# Patient Record
Sex: Male | Born: 1975 | Race: White | Hispanic: No | Marital: Married | Smoking: Former smoker
Health system: Southern US, Community
[De-identification: ages and names within clinical notes are randomized; demographics above are authoritative.]

## PROBLEM LIST (undated history)

## (undated) ENCOUNTER — Emergency Department: Discharge status: Absent without leave | Payer: BC Managed Care – PPO

## (undated) DIAGNOSIS — J309 Allergic rhinitis, unspecified: Secondary | ICD-10-CM

## (undated) DIAGNOSIS — I10 Essential (primary) hypertension: Secondary | ICD-10-CM

## (undated) DIAGNOSIS — F329 Major depressive disorder, single episode, unspecified: Secondary | ICD-10-CM

## (undated) DIAGNOSIS — K219 Gastro-esophageal reflux disease without esophagitis: Secondary | ICD-10-CM

## (undated) DIAGNOSIS — F419 Anxiety disorder, unspecified: Secondary | ICD-10-CM

## (undated) DIAGNOSIS — F32A Depression, unspecified: Secondary | ICD-10-CM

## (undated) HISTORY — PX: TONSILLECTOMY AND ADENOIDECTOMY: SUR1326

## (undated) HISTORY — PX: VASECTOMY: SHX75

## (undated) HISTORY — DX: Anxiety disorder, unspecified: F41.9

## (undated) HISTORY — DX: Gastro-esophageal reflux disease without esophagitis: K21.9

## (undated) HISTORY — DX: Major depressive disorder, single episode, unspecified: F32.9

## (undated) HISTORY — DX: Depression, unspecified: F32.A

## (undated) HISTORY — DX: Essential (primary) hypertension: I10

## (undated) HISTORY — PX: INGUINAL HERNIA REPAIR: SUR1180

## (undated) HISTORY — PX: FINGER SURGERY: SHX640

## (undated) HISTORY — DX: Allergic rhinitis, unspecified: J30.9

---

## 2000-08-13 ENCOUNTER — Encounter: Payer: Self-pay | Admitting: Emergency Medicine

## 2000-08-13 ENCOUNTER — Emergency Department (HOSPITAL_COMMUNITY): Admission: EM | Admit: 2000-08-13 | Discharge: 2000-08-13 | Payer: Self-pay | Admitting: Emergency Medicine

## 2002-09-09 ENCOUNTER — Ambulatory Visit (HOSPITAL_BASED_OUTPATIENT_CLINIC_OR_DEPARTMENT_OTHER): Admission: RE | Admit: 2002-09-09 | Discharge: 2002-09-09 | Payer: Self-pay | Admitting: Internal Medicine

## 2004-07-13 ENCOUNTER — Ambulatory Visit: Payer: Self-pay | Admitting: Internal Medicine

## 2005-11-06 ENCOUNTER — Ambulatory Visit: Payer: Self-pay | Admitting: Internal Medicine

## 2006-09-03 ENCOUNTER — Ambulatory Visit: Payer: Self-pay | Admitting: Internal Medicine

## 2006-09-03 LAB — CONVERTED CEMR LAB
ALT: 37 units/L (ref 0–40)
AST: 33 units/L (ref 0–37)
Albumin: 4.3 g/dL (ref 3.5–5.2)
Alkaline Phosphatase: 69 units/L (ref 39–117)
BUN: 11 mg/dL (ref 6–23)
Basophils Absolute: 0 10*3/uL (ref 0.0–0.1)
Basophils Relative: 0.6 % (ref 0.0–1.0)
CO2: 32 meq/L (ref 19–32)
Calcium: 9.6 mg/dL (ref 8.4–10.5)
Chloride: 105 meq/L (ref 96–112)
Chol/HDL Ratio, serum: 3.4
Cholesterol: 188 mg/dL (ref 0–200)
Creatinine, Ser: 1.1 mg/dL (ref 0.4–1.5)
Eosinophil percent: 4.2 % (ref 0.0–5.0)
GFR calc non Af Amer: 84 mL/min
Glomerular Filtration Rate, Af Am: 102 mL/min/{1.73_m2}
Glucose, Bld: 94 mg/dL (ref 70–99)
HCT: 45.9 % (ref 39.0–52.0)
HDL: 55.4 mg/dL (ref >39.0)
Hemoglobin: 14.9 g/dL (ref 13.0–17.0)
LDL Cholesterol: 115 mg/dL — ABNORMAL HIGH (ref 0–99)
Lymphocytes Relative: 29.9 % (ref 12.0–46.0)
MCHC: 32.5 g/dL (ref 30.0–36.0)
MCV: 89.3 fL (ref 78.0–100.0)
Monocytes Absolute: 0.6 10*3/uL (ref 0.2–0.7)
Monocytes Relative: 7.3 % (ref 3.0–11.0)
Neutro Abs: 4.6 10*3/uL (ref 1.4–7.7)
Neutrophils Relative %: 58 % (ref 43.0–77.0)
Platelets: 288 10*3/uL (ref 150–400)
Potassium: 4.3 meq/L (ref 3.5–5.1)
RBC: 5.14 M/uL (ref 4.22–5.81)
RDW: 12.3 % (ref 11.5–14.6)
Sodium: 143 meq/L (ref 135–145)
TSH: 0.76 microintl units/mL (ref 0.35–5.50)
Total Bilirubin: 1 mg/dL (ref 0.3–1.2)
Total Protein: 6.5 g/dL (ref 6.0–8.3)
Triglyceride fasting, serum: 90 mg/dL (ref 0–149)
VLDL: 18 mg/dL (ref 0–40)
WBC: 7.8 10*3/uL (ref 4.5–10.5)

## 2006-09-09 ENCOUNTER — Ambulatory Visit: Payer: Self-pay | Admitting: Internal Medicine

## 2006-11-03 ENCOUNTER — Ambulatory Visit: Payer: Self-pay | Admitting: Internal Medicine

## 2007-09-22 ENCOUNTER — Ambulatory Visit: Payer: Self-pay | Admitting: Internal Medicine

## 2007-09-22 DIAGNOSIS — F17200 Nicotine dependence, unspecified, uncomplicated: Secondary | ICD-10-CM

## 2007-09-22 DIAGNOSIS — F329 Major depressive disorder, single episode, unspecified: Secondary | ICD-10-CM

## 2008-03-21 ENCOUNTER — Ambulatory Visit: Payer: Self-pay | Admitting: Internal Medicine

## 2008-04-18 ENCOUNTER — Telehealth: Payer: Self-pay | Admitting: Internal Medicine

## 2008-08-23 ENCOUNTER — Telehealth: Payer: Self-pay | Admitting: Internal Medicine

## 2008-10-11 ENCOUNTER — Ambulatory Visit: Payer: Self-pay | Admitting: Internal Medicine

## 2008-10-11 LAB — CONVERTED CEMR LAB
ALT: 21 units/L (ref 0–53)
AST: 26 units/L (ref 0–37)
Albumin: 4.3 g/dL (ref 3.5–5.2)
Alkaline Phosphatase: 68 units/L (ref 39–117)
BUN: 22 mg/dL (ref 6–23)
Basophils Absolute: 0 10*3/uL (ref 0.0–0.1)
Basophils Relative: 0.5 % (ref 0.0–3.0)
Bilirubin, Direct: 0.1 mg/dL (ref 0.0–0.3)
CO2: 30 meq/L (ref 19–32)
Calcium: 9.4 mg/dL (ref 8.4–10.5)
Chloride: 102 meq/L (ref 96–112)
Cholesterol: 197 mg/dL (ref 0–200)
Creatinine, Ser: 0.8 mg/dL (ref 0.4–1.5)
Eosinophils Absolute: 0.2 10*3/uL (ref 0.0–0.7)
Eosinophils Relative: 2.9 % (ref 0.0–5.0)
GFR calc Af Amer: 144 mL/min
GFR calc non Af Amer: 119 mL/min
Glucose, Bld: 79 mg/dL (ref 70–99)
HCT: 46.3 % (ref 39.0–52.0)
HDL: 45 mg/dL (ref >39.0)
Hemoglobin: 15.9 g/dL (ref 13.0–17.0)
LDL Cholesterol: 129 mg/dL — ABNORMAL HIGH (ref 0–99)
Lymphocytes Relative: 27.7 % (ref 12.0–46.0)
MCHC: 34.4 g/dL (ref 30.0–36.0)
MCV: 91.4 fL (ref 78.0–100.0)
Monocytes Absolute: 0.8 10*3/uL (ref 0.1–1.0)
Monocytes Relative: 11.9 % (ref 3.0–12.0)
Neutro Abs: 3.9 10*3/uL (ref 1.4–7.7)
Neutrophils Relative %: 57 % (ref 43.0–77.0)
Platelets: 223 10*3/uL (ref 150–400)
Potassium: 4 meq/L (ref 3.5–5.1)
RBC: 5.06 M/uL (ref 4.22–5.81)
RDW: 12 % (ref 11.5–14.6)
Sodium: 138 meq/L (ref 135–145)
TSH: 1.12 microintl units/mL (ref 0.35–5.50)
Total Bilirubin: 1 mg/dL (ref 0.3–1.2)
Total CHOL/HDL Ratio: 4.4
Total Protein: 6.8 g/dL (ref 6.0–8.3)
Triglycerides: 114 mg/dL (ref 0–149)
VLDL: 23 mg/dL (ref 0–40)
WBC: 6.8 10*3/uL (ref 4.5–10.5)

## 2008-10-12 ENCOUNTER — Telehealth: Payer: Self-pay | Admitting: Internal Medicine

## 2008-10-18 ENCOUNTER — Ambulatory Visit: Payer: Self-pay | Admitting: Internal Medicine

## 2009-01-20 ENCOUNTER — Telehealth: Payer: Self-pay | Admitting: Internal Medicine

## 2009-02-20 ENCOUNTER — Ambulatory Visit: Payer: Self-pay | Admitting: Gastroenterology

## 2009-02-20 DIAGNOSIS — R1310 Dysphagia, unspecified: Secondary | ICD-10-CM | POA: Insufficient documentation

## 2009-03-04 ENCOUNTER — Emergency Department (HOSPITAL_COMMUNITY): Admission: EM | Admit: 2009-03-04 | Discharge: 2009-03-04 | Payer: Self-pay | Admitting: Emergency Medicine

## 2009-03-07 ENCOUNTER — Telehealth (INDEPENDENT_AMBULATORY_CARE_PROVIDER_SITE_OTHER): Payer: Self-pay | Admitting: *Deleted

## 2009-03-08 ENCOUNTER — Ambulatory Visit: Payer: Self-pay | Admitting: Gastroenterology

## 2009-03-09 ENCOUNTER — Ambulatory Visit: Payer: Self-pay | Admitting: Family Medicine

## 2009-03-10 ENCOUNTER — Encounter: Payer: Self-pay | Admitting: Gastroenterology

## 2009-03-16 ENCOUNTER — Telehealth (INDEPENDENT_AMBULATORY_CARE_PROVIDER_SITE_OTHER): Payer: Self-pay | Admitting: *Deleted

## 2009-06-23 ENCOUNTER — Ambulatory Visit: Payer: Self-pay | Admitting: Gastroenterology

## 2009-08-01 ENCOUNTER — Telehealth: Payer: Self-pay | Admitting: Internal Medicine

## 2009-08-03 ENCOUNTER — Telehealth: Payer: Self-pay | Admitting: Family Medicine

## 2009-08-04 ENCOUNTER — Encounter (INDEPENDENT_AMBULATORY_CARE_PROVIDER_SITE_OTHER): Payer: Self-pay | Admitting: *Deleted
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
## 2009-09-14 ENCOUNTER — Encounter: Payer: Self-pay | Admitting: Internal Medicine

## 2009-09-25 ENCOUNTER — Telehealth (INDEPENDENT_AMBULATORY_CARE_PROVIDER_SITE_OTHER): Payer: Self-pay | Admitting: *Deleted

## 2009-10-31 ENCOUNTER — Ambulatory Visit: Payer: Self-pay | Admitting: Internal Medicine

## 2009-10-31 LAB — CONVERTED CEMR LAB
ALT: 16 units/L (ref 0–53)
AST: 24 units/L (ref 0–37)
Albumin: 4.6 g/dL (ref 3.5–5.2)
Alkaline Phosphatase: 60 units/L (ref 39–117)
BUN: 12 mg/dL (ref 6–23)
Basophils Absolute: 0 10*3/uL (ref 0.0–0.1)
Basophils Relative: 0.2 % (ref 0.0–3.0)
Bilirubin, Direct: 0.1 mg/dL (ref 0.0–0.3)
CO2: 32 meq/L (ref 19–32)
Calcium: 9.5 mg/dL (ref 8.4–10.5)
Chloride: 108 meq/L (ref 96–112)
Cholesterol: 184 mg/dL (ref 0–200)
Creatinine, Ser: 1 mg/dL (ref 0.4–1.5)
Eosinophils Absolute: 0.2 10*3/uL (ref 0.0–0.7)
Eosinophils Relative: 3.9 % (ref 0.0–5.0)
GFR calc non Af Amer: 91.39 mL/min (ref >60)
Glucose, Bld: 87 mg/dL (ref 70–99)
HCT: 43.8 % (ref 39.0–52.0)
HDL: 53.1 mg/dL (ref >39.00)
Hemoglobin: 14.7 g/dL (ref 13.0–17.0)
LDL Cholesterol: 114 mg/dL — ABNORMAL HIGH (ref 0–99)
Lymphocytes Relative: 33.6 % (ref 12.0–46.0)
Lymphs Abs: 1.8 10*3/uL (ref 0.7–4.0)
MCHC: 33.5 g/dL (ref 30.0–36.0)
MCV: 94.2 fL (ref 78.0–100.0)
Monocytes Absolute: 0.4 10*3/uL (ref 0.1–1.0)
Monocytes Relative: 8 % (ref 3.0–12.0)
Neutro Abs: 3.1 10*3/uL (ref 1.4–7.7)
Neutrophils Relative %: 54.3 % (ref 43.0–77.0)
Platelets: 219 10*3/uL (ref 150.0–400.0)
Potassium: 4.2 meq/L (ref 3.5–5.1)
RBC: 4.65 M/uL (ref 4.22–5.81)
RDW: 12.3 % (ref 11.5–14.6)
Sodium: 143 meq/L (ref 135–145)
TSH: 1.3 microintl units/mL (ref 0.35–5.50)
Total Bilirubin: 0.9 mg/dL (ref 0.3–1.2)
Total CHOL/HDL Ratio: 3
Total Protein: 7.1 g/dL (ref 6.0–8.3)
Triglycerides: 85 mg/dL (ref 0.0–149.0)
VLDL: 17 mg/dL (ref 0.0–40.0)
WBC: 5.5 10*3/uL (ref 4.5–10.5)

## 2009-12-05 ENCOUNTER — Ambulatory Visit: Payer: Self-pay | Admitting: Internal Medicine

## 2010-10-02 NOTE — Assessment & Plan Note (Signed)
Summary: cpx//ccm [facility] BMP/NJR   Vital Signs:  Patient profile:   34 year old male Height:      68.75 inches Weight:      225 pounds BMI:     33.59 Pulse rate:   66 / minute Pulse rhythm:   regular Resp:     12 per minute BP sitting:   112 / 76  (left arm) Cuff size:   regular  Vitals Entered By: Gladis Riffle, RN (December 05, 2009 8:12 AM) CC: cpx, labs done Is Patient Diabetic? No   Primary Care Provider:  Leeanne Deed  CC:  cpx and labs done.  History of Present Illness: CPX  recurrent dysphagia: food gets stuck once weekly: reviewed EGD and pathology.     Preventive Screening-Counseling & Management  Alcohol-Tobacco     Smoking Status: quit     Year Quit: 2009  Current Problems (verified): 1)  Dysphagia Unspecified  (ICD-787.20) 2)  Preventive Health Care  (ICD-V70.0) 3)  Tobacco Use  (ICD-305.1) 4)  Depression  (ICD-311)  Current Medications (verified): 1)  Citalopram Hydrobromide 40 Mg Tabs (Citalopram Hydrobromide) .... Once Daily 2)  Benadryl 25 Mg Caps (Diphenhydramine Hcl) .... As Needed At Bedtime 3)  Omeprazole 40 Mg  Cpdr (Omeprazole) .Marland Kitchen.. 1 Each Day 30 Minutes Before Meal  Allergies: 1)  ! Penicillin V Potassium (Penicillin V Potassium)   Impression & Recommendations:  Problem # 1:  PREVENTIVE HEALTH CARE (ICD-V70.0) helath maint UTD advised daily exercise (he is currently doing) advised weight loss  Complete Medication List: 1)  Citalopram Hydrobromide 40 Mg Tabs (Citalopram hydrobromide) .... Once daily 2)  Benadryl 25 Mg Caps (Diphenhydramine hcl) .... As needed at bedtime 3)  Omeprazole 20 Mg Cpdr (Omeprazole) .... One by mouth daily  Patient Instructions: 1)  . Prescriptions: OMEPRAZOLE 20 MG CPDR (OMEPRAZOLE) one by mouth daily  #90 x 3   Entered and Authorized by:   Birdie Sons MD   Signed by:   Birdie Sons MD on 12/05/2009   Method used:   Electronically to        [facility]* (retail)       [address]       [place], [place]  [id number]       Ph: [phone]       Fax: [phone]   RxID:   [id number] OMEPRAZOLE 40 MG  CPDR (OMEPRAZOLE) 1 each day 30 minutes before meal  #90 x 1   Entered and Authorized by:   Birdie Sons MD   Signed by:   Birdie Sons MD on 12/05/2009   Method used:   Electronically to        [facility]* (retail)       [address]       [place], [place]  [address]       Ph: [phone]       Fax: [phone]   RxID:   [id number] CITALOPRAM HYDROBROMIDE 40 MG TABS (CITALOPRAM HYDROBROMIDE) once daily  #90 x 3   Entered and Authorized by:   Birdie Sons MD   Signed by:   Birdie Sons MD on 12/05/2009   Method used:   Electronically to        [facility]* (retail)       [address]       [place], [place]  [address]       Ph: [phone]       Fax: [phone]  RxID:   [id number]  Physical Exam General Appearance: well developed, well nourished, no acute distress Eyes: conjunctiva and lids normal, PERRL, EOMI,  Ears, Nose, Mouth, Throat: TM clear, nares clear, oral exam WNL Neck: supple, no lymphadenopathy, no thyromegaly, no JVD Respiratory: clear to auscultation and percussion, respiratory effort normal Cardiovascular: regular rate and rhythm, S1-S2, no murmur, rub or gallop, no bruits, peripheral pulses normal and symmetric, no cyanosis, clubbing, edema or varicosities Chest: no scars, masses, tenderness; no asymmetry, skin changes, nipple discharge, no gynecomastia   Gastrointestinal: soft, non-tender; no hepatosplenomegaly, masses; active bowel sounds all quadrants, Lymphatic: no cervical, axillary or inguinal adenopathy Musculoskeletal: gait normal, muscle tone and strength WNL, no joint swelling, effusions, discoloration, crepitus  Skin: clear, good turgor, color WNL, no rashes, lesions, or ulcerations Neurologic: normal mental status, normal reflexes, normal strength, sensation, and  motion Psychiatric: alert; oriented to person, place and time Other Exam:

## 2010-10-02 NOTE — Letter (Signed)
Summary: [facility]  [facility]   Imported By: Maryln Gottron 10/06/2009 14:13:47  _____________________________________________________________________  External Attachment:    Type:   Image     Comment:   External Document

## 2010-10-02 NOTE — Progress Notes (Signed)
Summary: Record request  Request for records received from [facility]. Request forwarded to [facility]. Wilder Glade  September 25, 2009 4:32 PM

## 2010-11-02 ENCOUNTER — Other Ambulatory Visit: Payer: Self-pay | Admitting: Internal Medicine

## 2010-11-22 ENCOUNTER — Other Ambulatory Visit (INDEPENDENT_AMBULATORY_CARE_PROVIDER_SITE_OTHER): Payer: Self-pay | Admitting: Internal Medicine

## 2010-11-22 DIAGNOSIS — Z0000 Encounter for general adult medical examination without abnormal findings: Secondary | ICD-10-CM

## 2010-11-22 LAB — BASIC METABOLIC PANEL
BUN: 19 mg/dL (ref 6–23)
CO2: 29 mEq/L (ref 19–32)
Calcium: 9 mg/dL (ref 8.4–10.5)
Chloride: 101 mEq/L (ref 96–112)
Creatinine, Ser: 1 mg/dL (ref 0.4–1.5)
GFR: 96.35 mL/min (ref >60.00)
Glucose, Bld: 84 mg/dL (ref 70–99)
Potassium: 4.7 mEq/L (ref 3.5–5.1)
Sodium: 136 mEq/L (ref 135–145)

## 2010-11-22 LAB — CBC WITH DIFFERENTIAL/PLATELET
Basophils Relative: 0.5 % (ref 0.0–3.0)
Eosinophils Absolute: 0.3 10*3/uL (ref 0.0–0.7)
Eosinophils Relative: 4.8 % (ref 0.0–5.0)
Hemoglobin: 14.5 g/dL (ref 13.0–17.0)
Lymphocytes Relative: 33.9 % (ref 12.0–46.0)
MCHC: 33.8 g/dL (ref 30.0–36.0)
MCV: 91.4 fl (ref 78.0–100.0)
Monocytes Absolute: 0.5 10*3/uL (ref 0.1–1.0)
Neutro Abs: 3.2 10*3/uL (ref 1.4–7.7)
RBC: 4.7 Mil/uL (ref 4.22–5.81)
WBC: 6.1 10*3/uL (ref 4.5–10.5)

## 2010-11-22 LAB — POCT URINALYSIS DIPSTICK
Bilirubin, UA: NEGATIVE
Glucose, UA: NEGATIVE
Ketones, UA: NEGATIVE
Leukocytes, UA: NEGATIVE
Nitrite, UA: NEGATIVE
pH, UA: 7

## 2010-11-22 LAB — HEPATIC FUNCTION PANEL
ALT: 19 U/L (ref 0–53)
Albumin: 4.2 g/dL (ref 3.5–5.2)
Alkaline Phosphatase: 60 U/L (ref 39–117)
Total Protein: 6.5 g/dL (ref 6.0–8.3)

## 2010-11-22 LAB — LIPID PANEL
HDL: 41.7 mg/dL (ref >39.00)
Total CHOL/HDL Ratio: 4

## 2010-11-22 LAB — TSH: TSH: 1.16 u[IU]/mL (ref 0.35–5.50)

## 2010-12-06 ENCOUNTER — Encounter: Payer: Self-pay | Admitting: Internal Medicine

## 2010-12-10 ENCOUNTER — Ambulatory Visit (INDEPENDENT_AMBULATORY_CARE_PROVIDER_SITE_OTHER): Payer: BLUE CROSS/BLUE SHIELD | Admitting: Internal Medicine

## 2010-12-10 ENCOUNTER — Encounter: Payer: Self-pay | Admitting: Internal Medicine

## 2010-12-10 DIAGNOSIS — R1310 Dysphagia, unspecified: Secondary | ICD-10-CM

## 2010-12-10 DIAGNOSIS — Z0000 Encounter for general adult medical examination without abnormal findings: Secondary | ICD-10-CM

## 2010-12-10 DIAGNOSIS — F17200 Nicotine dependence, unspecified, uncomplicated: Secondary | ICD-10-CM

## 2010-12-10 DIAGNOSIS — F909 Attention-deficit hyperactivity disorder, unspecified type: Secondary | ICD-10-CM | POA: Insufficient documentation

## 2010-12-10 MED ORDER — VARENICLINE TARTRATE 1 MG PO TABS: 1.0000 mg | ORAL_TABLET | Freq: Two times a day (BID) | ORAL | Status: AC

## 2010-12-10 MED ORDER — ATOMOXETINE HCL 40 MG PO CAPS: 40.0000 mg | ORAL_CAPSULE | Freq: Every day | ORAL | Status: DC

## 2010-12-10 MED ORDER — VARENICLINE TARTRATE 0.5 MG X 11 & 1 MG X 42 PO MISC: ORAL | Status: AC

## 2010-12-10 NOTE — Assessment & Plan Note (Signed)
Patient has had remarkably good results on a PPI. He'll continue over-the-counter usage. I suspect this would get better if he lost weight.

## 2010-12-10 NOTE — Progress Notes (Signed)
  Subjective:    Patient ID: Ernest Bowen, male    DOB: 31-Aug-1976, 35 y.o.   MRN: [id number]  HPI  cpx  Chewing tobacco again---would like to try Chantix again (worked previously)  Mood-- doing better  New problem: pt states he was dx with ADHD as a "kid". He admits to mind wondering all of the time. Sxs affect his job and home life. Pt states that he had a lot of testing at the time.   Past Medical History  Diagnosis Date  . Depression    No past surgical history on file.  reports that he has quit smoking. His smokeless tobacco use includes Chew. He reports that he drinks alcohol. He reports that he does not use illicit drugs. family history includes Colon cancer in his maternal grandfather. Allergies  Allergen Reactions  . Penicillins     REACTION: as child     Review of Systems  patient denies chest pain, shortness of breath, orthopnea. Denies lower extremity edema, abdominal pain, change in appetite, change in bowel movements. Patient denies rashes, musculoskeletal complaints. No other specific complaints in a complete review of systems.      Objective:   Physical Exam Well-developed male in no acute distress. HEENT exam atraumatic, normocephalic, extraocular muscles are intact. Conjunctivae are pink without exudate. Neck is supple without lymphadenopathy, thyromegaly, jugular venous distention. Chest is clear to auscultation without increased work of breathing. Cardiac exam S1-S2 are regular. The PMI is normal. No significant murmurs or gallops. Abdominal exam active bowel sounds, soft, nontender. No abdominal bruits. Extremities no clubbing cyanosis or edema. Peripheral pulses are normal without bruits. Neurologic exam alert and oriented without any motor or sensory deficits.      Assessment & Plan:  Well visit: health maint UTD--- Quit all tobacco use

## 2010-12-10 NOTE — Assessment & Plan Note (Signed)
Discussed need for cessation chantix---side effects discussed

## 2010-12-10 NOTE — Assessment & Plan Note (Signed)
This is a new diagnosis, however, I will use testing based on his diagnoses as a child. He can't afford additional testing at this time. I'm concerned he won't be able to afford Strattera either. I discussed with  I discussed side effects. I've given him a prescription for Strattera.

## 2010-12-11 ENCOUNTER — Telehealth: Payer: Self-pay | Admitting: *Deleted

## 2010-12-11 NOTE — Telephone Encounter (Signed)
Pt is asking for Strattera samples.  Advised we do not carry those.

## 2010-12-12 ENCOUNTER — Telehealth: Payer: Self-pay | Admitting: *Deleted

## 2010-12-12 NOTE — Telephone Encounter (Signed)
Pt can't afford strattera, pharmacy wants to know if rx can be changed

## 2010-12-13 NOTE — Telephone Encounter (Signed)
Pt does not want a stimulant so he is going to tough it out.  Will call back if he needs it changed

## 2010-12-13 NOTE — Telephone Encounter (Signed)
The only other option is a stimulant- adderall 10 mg 1 po qd #30/0 refills if the patient wants to try it (he did not want to at the time of OV)

## 2011-01-10 ENCOUNTER — Telehealth: Payer: Self-pay | Admitting: Internal Medicine

## 2011-01-10 DIAGNOSIS — F909 Attention-deficit hyperactivity disorder, unspecified type: Secondary | ICD-10-CM

## 2011-01-10 MED ORDER — ATOMOXETINE HCL 80 MG PO CAPS: 80.0000 mg | ORAL_CAPSULE | Freq: Every day | ORAL | Status: DC

## 2011-01-10 NOTE — Telephone Encounter (Signed)
Ok to increase to 80 mg po qd

## 2011-01-10 NOTE — Telephone Encounter (Signed)
Needs new rx for Strattera 80mg , sent to [facility].  Pt was told by md that if the 40mg  was not enough, then he could call in for 80mg .

## 2011-01-10 NOTE — Telephone Encounter (Signed)
rx sent in to [facility], pt aware

## 2011-03-12 ENCOUNTER — Other Ambulatory Visit: Payer: Self-pay | Admitting: *Deleted

## 2011-03-12 MED ORDER — OMEPRAZOLE 20 MG PO CPDR: 20.0000 mg | DELAYED_RELEASE_CAPSULE | Freq: Every day | ORAL | Status: DC

## 2011-03-22 ENCOUNTER — Other Ambulatory Visit: Payer: Self-pay | Admitting: *Deleted

## 2011-03-22 MED ORDER — OMEPRAZOLE 20 MG PO CPDR: 20.0000 mg | DELAYED_RELEASE_CAPSULE | Freq: Every day | ORAL | Status: DC

## 2011-03-26 ENCOUNTER — Other Ambulatory Visit: Payer: Self-pay | Admitting: *Deleted

## 2011-05-03 ENCOUNTER — Other Ambulatory Visit: Payer: Self-pay | Admitting: Internal Medicine

## 2011-05-03 DIAGNOSIS — F909 Attention-deficit hyperactivity disorder, unspecified type: Secondary | ICD-10-CM

## 2011-05-03 NOTE — Telephone Encounter (Signed)
Pt called and has changed pharmacies.Pt needs refills for atomoxetine (STRATTERA) 80 MG capsule  Pt now uses [facility] [phone].

## 2011-05-07 MED ORDER — ATOMOXETINE HCL 80 MG PO CAPS: 80.0000 mg | ORAL_CAPSULE | Freq: Every day | ORAL | Status: DC

## 2011-05-28 ENCOUNTER — Other Ambulatory Visit: Payer: Self-pay | Admitting: *Deleted

## 2011-05-28 MED ORDER — OMEPRAZOLE 20 MG PO CPDR: 20.0000 mg | DELAYED_RELEASE_CAPSULE | Freq: Every day | ORAL | Status: DC

## 2011-06-14 ENCOUNTER — Other Ambulatory Visit: Payer: Self-pay | Admitting: *Deleted

## 2011-06-14 MED ORDER — CITALOPRAM HYDROBROMIDE 40 MG PO TABS: 40.0000 mg | ORAL_TABLET | Freq: Every day | ORAL | Status: DC

## 2011-06-17 ENCOUNTER — Other Ambulatory Visit: Payer: Self-pay | Admitting: *Deleted

## 2011-06-17 MED ORDER — CITALOPRAM HYDROBROMIDE 40 MG PO TABS: 40.0000 mg | ORAL_TABLET | Freq: Every day | ORAL | Status: DC

## 2011-10-14 ENCOUNTER — Telehealth: Payer: Self-pay | Admitting: Internal Medicine

## 2011-10-16 NOTE — Telephone Encounter (Signed)
Opened in error

## 2012-05-03 ENCOUNTER — Other Ambulatory Visit: Payer: Self-pay | Admitting: Internal Medicine

## 2012-05-05 ENCOUNTER — Ambulatory Visit: Payer: BLUE CROSS/BLUE SHIELD | Admitting: Family

## 2012-05-06 ENCOUNTER — Ambulatory Visit (INDEPENDENT_AMBULATORY_CARE_PROVIDER_SITE_OTHER): Payer: Self-pay | Admitting: Family Medicine

## 2012-05-06 VITALS — BP 110/82 | Temp 97.7°F | Wt 233.0 lb

## 2012-05-06 DIAGNOSIS — K219 Gastro-esophageal reflux disease without esophagitis: Secondary | ICD-10-CM

## 2012-05-06 DIAGNOSIS — F329 Major depressive disorder, single episode, unspecified: Secondary | ICD-10-CM

## 2012-05-06 DIAGNOSIS — F909 Attention-deficit hyperactivity disorder, unspecified type: Secondary | ICD-10-CM

## 2012-05-06 MED ORDER — ATOMOXETINE HCL 80 MG PO CAPS: 80.0000 mg | ORAL_CAPSULE | Freq: Every day | ORAL | Status: DC

## 2012-05-06 MED ORDER — OMEPRAZOLE 20 MG PO CPDR: 20.0000 mg | DELAYED_RELEASE_CAPSULE | Freq: Every day | ORAL | Status: DC

## 2012-05-06 MED ORDER — CITALOPRAM HYDROBROMIDE 40 MG PO TABS: 40.0000 mg | ORAL_TABLET | Freq: Every day | ORAL | Status: DC

## 2012-05-06 NOTE — Progress Notes (Signed)
  Subjective:    Patient ID: Ernest Bowen, male    DOB: [birth year], 36 y.o.   MRN: [id number]  HPI  Medical followup. Patient history of depression, ADHD, and GERD. Symptomatically stable. Strattera increased 80 mg last year and this helped tremendously.  Has helped with focusing and completing work issues. He has history of anxiety and depression stable on Celexa 40 mg daily. No recent GERD symptoms. Question of eosinophilic esophagitis by previous endoscopy. He does not recall ever treated with topical steroids. No recent dysphagia. No appetite or weight changes.  Remains on Prilosec.  Breakthrough GERD symptoms when trying to stop.  Past Medical History  Diagnosis Date  . Depression    No past surgical history on file.  reports that he has quit smoking. His smokeless tobacco use includes Chew. He reports that he drinks alcohol. He reports that he does not use illicit drugs. family history includes Colon cancer in his maternal grandfather. Allergies  Allergen Reactions  . Penicillins     REACTION: as child      Review of Systems  Respiratory: Negative for cough and shortness of breath.   Cardiovascular: Negative for chest pain.  Gastrointestinal: Negative for abdominal pain.  Psychiatric/Behavioral: Negative for dysphoric mood. The patient is not nervous/anxious.        Objective:   Physical Exam  Constitutional: He appears well-developed and well-nourished.  Cardiovascular: Normal rate and regular rhythm.   Pulmonary/Chest: Effort normal and breath sounds normal. No respiratory distress. He has no wheezes. He has no rales.  Musculoskeletal: He exhibits no edema.  Psychiatric: He has a normal mood and affect. His behavior is normal.          Assessment & Plan:  #1 ADHD. Refill Strattera for one year #2 GERD. Symptomatically stable. Refill omeprazole for one year #3 history of anxiety/depression. Stable. Refill Celexa for one year

## 2012-05-07 ENCOUNTER — Encounter: Payer: Self-pay | Admitting: Family Medicine

## 2012-05-07 DIAGNOSIS — K219 Gastro-esophageal reflux disease without esophagitis: Secondary | ICD-10-CM | POA: Insufficient documentation

## 2012-06-02 ENCOUNTER — Other Ambulatory Visit: Payer: Self-pay | Admitting: Internal Medicine

## 2012-06-16 ENCOUNTER — Ambulatory Visit (INDEPENDENT_AMBULATORY_CARE_PROVIDER_SITE_OTHER): Payer: Managed Care, Other (non HMO) | Admitting: Family Medicine

## 2012-06-16 ENCOUNTER — Encounter: Payer: Self-pay | Admitting: Family Medicine

## 2012-06-16 VITALS — BP 130/78 | HR 73 | Temp 98.9°F | Wt 232.0 lb

## 2012-06-16 DIAGNOSIS — J029 Acute pharyngitis, unspecified: Secondary | ICD-10-CM

## 2012-06-16 DIAGNOSIS — J069 Acute upper respiratory infection, unspecified: Secondary | ICD-10-CM

## 2012-06-16 DIAGNOSIS — R509 Fever, unspecified: Secondary | ICD-10-CM

## 2012-06-16 LAB — POCT INFLUENZA A/B: Influenza A, POC: NEGATIVE

## 2012-06-16 MED ORDER — GUAIFENESIN-CODEINE 100-10 MG/5ML PO SYRP: 5.0000 mL | ORAL_SOLUTION | Freq: Every evening | ORAL | Status: DC | PRN

## 2012-06-16 MED ORDER — SULFAMETHOXAZOLE-TRIMETHOPRIM 800-160 MG PO TABS: 1.0000 | ORAL_TABLET | Freq: Two times a day (BID) | ORAL | Status: DC

## 2012-06-16 MED ORDER — FLUTICASONE PROPIONATE 50 MCG/ACT NA SUSP: 2.0000 | Freq: Every day | NASAL | Status: DC

## 2012-06-16 NOTE — Progress Notes (Signed)
Chief Complaint  Patient presents with  . [facility]    fever, headache, coughing fits.     HPI:  Started 4 days ago Symptoms: headache all over, cough, tired, drainage, fevers, body chills and body aches (fever up to 102 and had fever last night), sore throat, nasal congestion Needs something for cough [facility] and [facility] test which was negative and told to use claritin Has been using claritin and this has helped nasal congestion. Denies: CP, SOB, vision changes Denies mono, flu, strep contact or insect bites or camping or recent travel ROS: See pertinent positives and negatives per HPI.  Past Medical History  Diagnosis Date  . Depression     Family History  Problem Relation Age of Onset  . Colon cancer Maternal Grandfather     History   Social History  . Marital Status: Married    Spouse Name: N/A    Number of Children: N/A  . Years of Education: N/A   Social History Main Topics  . Smoking status: Former Games developer  . Smokeless tobacco: Current User    Types: Chew  . Alcohol Use: Yes  . Drug Use: No  . Sexually Active: None   Other Topics Concern  . None   Social History Narrative  . None    Current outpatient prescriptions:atomoxetine (STRATTERA) 80 MG capsule, Take 1 capsule (80 mg total) by mouth daily., Disp: 30 capsule, Rfl: 11;  citalopram (CELEXA) 40 MG tablet, Take 1 tablet (40 mg total) by mouth daily., Disp: 30 tablet, Rfl: 11;  omeprazole (PRILOSEC) 20 MG capsule, Take 1 capsule (20 mg total) by mouth daily., Disp: 30 capsule, Rfl: 11 omeprazole (PRILOSEC) 20 MG capsule, TAKE ONE CAPSULE BY MOUTH EVERY DAY, Disp: 90 capsule, Rfl: 2;  fluticasone (FLONASE) 50 MCG/ACT nasal spray, Place 2 sprays into the nose daily., Disp: 16 g, Rfl: 6;  guaiFENesin-codeine (ROBITUSSIN AC) 100-10 MG/5ML syrup, Take 5 mLs by mouth at bedtime as needed for cough., Disp: 120 mL, Rfl: 0 sulfamethoxazole-trimethoprim (BACTRIM DS,SEPTRA DS) 800-160 MG per  tablet, Take 1 tablet by mouth 2 (two) times daily., Disp: 20 tablet, Rfl: 0  EXAM:  Filed Vitals:   06/16/12 1338  BP: 130/78  Pulse: 73  Temp: 98.9 F (37.2 C)    There is no height on file to calculate BMI.  GENERAL: vitals reviewed and listed above, alert, oriented, appears well hydrated and in no acute distress  HEENT: atraumatic, conjunttiva clear, no obvious abnormalities on inspection of external nose and ears - ear canals normal, some mild clear fluid behind clear, non-bulging TMs, white rhinorrhea with erythematous swollen turbinates, PND with oropharyngeal erythema  NECK: no obvious masses on inspection, mild mobile tender ant cervical LAD, no meningeal signs  LUNGS: clear to auscultation bilaterally, no wheezes, rales or rhonchi, good air movement  CV: HRRR, no peripheral edema  MS: moves all extremities without noticeable abnormality  NEURO: CN II-XII grossly intact and PERRLA  PSYCH: pleasant and cooperative, no obvious depression or anxiety  ASSESSMENT AND PLAN:  Discussed the following assessment and plan:  1. Upper respiratory infection  POC Influenza A/B, POC Rapid Strep A, Throat culture, guaiFENesin-codeine (ROBITUSSIN AC) 100-10 MG/5ML syrup, fluticasone (FLONASE) 50 MCG/ACT nasal spray   -symptoms c/w with influenza, will check flu swab - also with ongoing upper resp symptoms and fever last so night feel abx warranted if flu negative (would use bactrim as pen allergy and on celexa)- will also check strep though  doubt this etiology with cough -cough medication and supportive care measures provided -Patient advised to return or notify a doctor immediately if symptoms worsen or persist or new concerns arise.  Patient Instructions  NSTRUCTIONS FOR UPPER RESPIRATORY INFECTION:  -plenty of rest and fluids  -nasal saline wash 2-3 times daily (use prepackaged nasal saline or bottled/distilled water if making your own)   -clean nose with nasal saline before  using the nasal steroid or sinex  -can use sinex nasal spray for drainage and nasal congestion - but do NOT use longer then 3-4 days  -can use tylenol or ibuprofen as directed for aches and sorethroat  -in the winter time, using a humidifier at night is helpful (please follow cleaning instructions)  -if you are taking a cough medication - use only as directed, may also try a teaspoon of honey to coat the throat and throat lozenges  -for sore throat, salt water gargles can help  -follow up immediately if you have continued fevers, are worsening or not getting better in 2-3 days      Juni Glaab, Dahlia Client R.

## 2012-06-16 NOTE — Patient Instructions (Addendum)
NSTRUCTIONS FOR UPPER RESPIRATORY INFECTION:  -plenty of rest and fluids  -nasal saline wash 2-3 times daily (use prepackaged nasal saline or bottled/distilled water if making your own)   -clean nose with nasal saline before using the nasal steroid or sinex  -can use sinex nasal spray for drainage and nasal congestion - but do NOT use longer then 3-4 days  -can use tylenol or ibuprofen as directed for aches and sorethroat  -in the winter time, using a humidifier at night is helpful (please follow cleaning instructions)  -if you are taking a cough medication - use only as directed, may also try a teaspoon of honey to coat the throat and throat lozenges  -for sore throat, salt water gargles can help  -follow up immediately if you have continued fevers, are worsening or not getting better in 2-3 days

## 2012-11-20 ENCOUNTER — Ambulatory Visit (INDEPENDENT_AMBULATORY_CARE_PROVIDER_SITE_OTHER): Payer: Managed Care, Other (non HMO) | Admitting: Licensed Clinical Social Worker

## 2012-11-20 DIAGNOSIS — F411 Generalized anxiety disorder: Secondary | ICD-10-CM

## 2013-04-28 ENCOUNTER — Other Ambulatory Visit: Payer: Self-pay | Admitting: Family Medicine

## 2013-05-31 ENCOUNTER — Other Ambulatory Visit: Payer: Self-pay | Admitting: Family Medicine

## 2013-09-28 ENCOUNTER — Other Ambulatory Visit: Payer: Self-pay | Admitting: Internal Medicine

## 2014-02-01 ENCOUNTER — Ambulatory Visit (INDEPENDENT_AMBULATORY_CARE_PROVIDER_SITE_OTHER): Payer: BC Managed Care – PPO | Admitting: Emergency Medicine

## 2014-02-01 VITALS — BP 125/70 | HR 66 | Temp 97.0°F | Resp 16 | Ht 69.0 in | Wt 228.0 lb

## 2014-02-01 DIAGNOSIS — J018 Other acute sinusitis: Secondary | ICD-10-CM

## 2014-02-01 DIAGNOSIS — J209 Acute bronchitis, unspecified: Secondary | ICD-10-CM

## 2014-02-01 MED ORDER — PROMETHAZINE-CODEINE 6.25-10 MG/5ML PO SYRP: 5.0000 mL | ORAL_SOLUTION | Freq: Four times a day (QID) | ORAL | Status: DC | PRN

## 2014-02-01 MED ORDER — LEVOFLOXACIN 500 MG PO TABS: 500.0000 mg | ORAL_TABLET | Freq: Every day | ORAL | Status: AC

## 2014-02-01 MED ORDER — PSEUDOEPHEDRINE-GUAIFENESIN ER 60-600 MG PO TB12: 1.0000 | ORAL_TABLET | Freq: Two times a day (BID) | ORAL | Status: AC

## 2014-02-01 NOTE — Progress Notes (Signed)
[facility] [address] [place] [address] [phone]  Date:  02/01/2014   Name:  Ernest Bowen   DOB:  [birth year]   MRN:  [id number]  PCP:  Chancy Hurter, MD    Chief Complaint: Sore Throat and Cough   History of Present Illness:  Ernest Bowen is a 38 y.o. very pleasant male patient who presents with the following:  Ill for several days with nasal congestion and drainage purulent in nature.  Has a cough with a purulent nature.  Feels "bad" overall with fever.  No chills.  No wheezing or shortness of breath.  No nausea or vomiting.  No improvement with over the counter medications or other home remedies. Denies other complaint or health concern today.   Patient Active Problem List   Diagnosis Date Noted  . GERD (gastroesophageal reflux disease) 05/07/2012  . ADHD (attention deficit hyperactivity disorder) 12/10/2010  . DYSPHAGIA UNSPECIFIED 02/20/2009  . TOBACCO USE 09/22/2007  . DEPRESSION 09/22/2007    Past Medical History  Diagnosis Date  . Depression     Past Surgical History  Procedure Laterality Date  . Vasectomy      History  Substance Use Topics  . Smoking status: Former Research scientist (life sciences)  . Smokeless tobacco: Current User    Types: Chew  . Alcohol Use: Yes    Family History  Problem Relation Age of Onset  . Colon cancer Maternal Grandfather     Allergies  Allergen Reactions  . Penicillins     REACTION: as child    Medication list has been reviewed and updated.  Current Outpatient Prescriptions on File Prior to Visit  Medication Sig Dispense Refill  . citalopram (CELEXA) 40 MG tablet TAKE ONE TABLET BY MOUTH EVERY DAY  30 tablet  0  . fluticasone (FLONASE) 50 MCG/ACT nasal spray Place 2 sprays into the nose daily.  16 g  6  . omeprazole (PRILOSEC) 20 MG capsule TAKE ONE CAPSULE BY MOUTH EVERY DAY  90 capsule  2  . atomoxetine (STRATTERA) 80 MG capsule Take 1 capsule (80 mg total) by mouth daily.  30 capsule  11  .  guaiFENesin-codeine (ROBITUSSIN AC) 100-10 MG/5ML syrup Take 5 mLs by mouth at bedtime as needed for cough.  120 mL  0  . omeprazole (PRILOSEC) 20 MG capsule Take 1 capsule (20 mg total) by mouth daily.  30 capsule  11  . sulfamethoxazole-trimethoprim (BACTRIM DS,SEPTRA DS) 800-160 MG per tablet Take 1 tablet by mouth 2 (two) times daily.  20 tablet  0   No current facility-administered medications on file prior to visit.    Review of Systems:  As per HPI, otherwise negative.    Physical Examination: Filed Vitals:   02/01/14 0845  BP: 125/70  Pulse: 66  Temp: 97 F (36.1 C)  Resp: 16   Filed Vitals:   02/01/14 0845  Height: 5\' 9"  (1.753 m)  Weight: 228 lb (103.42 kg)   Body mass index is 33.65 kg/(m^2). Ideal Body Weight: Weight in (lb) to have BMI = 25: 168.9  GEN: WDWN, NAD, Non-toxic, A & O x 3 HEENT: Atraumatic, Normocephalic. Neck supple. No masses, No LAD. Ears and Nose: No external deformity. CV: RRR, No M/G/R. No JVD. No thrill. No extra heart sounds. PULM: CTA B, no wheezes, crackles, rhonchi. No retractions. No resp. distress. No accessory muscle use. ABD: S, NT, ND, +BS. No rebound. No HSM. EXTR: No c/c/e NEURO Normal gait.  PSYCH: Normally interactive. Conversant.  Not depressed or anxious appearing.  Calm demeanor.    Assessment and Plan: Sinusitis Bronchitis augmentin mucinex d Phen c cod  Signed,  Ellison Carwin, MD

## 2014-02-01 NOTE — Patient Instructions (Signed)

## 2014-07-03 ENCOUNTER — Encounter (HOSPITAL_COMMUNITY): Payer: Self-pay | Admitting: Family Medicine

## 2014-07-03 ENCOUNTER — Emergency Department (HOSPITAL_COMMUNITY)
Admission: EM | Admit: 2014-07-03 | Discharge: 2014-07-03 | Disposition: A | Discharge status: Home / Self Care | Payer: BC Managed Care – PPO | Attending: Emergency Medicine | Admitting: Emergency Medicine

## 2014-07-03 DIAGNOSIS — Z791 Long term (current) use of non-steroidal anti-inflammatories (NSAID): Secondary | ICD-10-CM | POA: Insufficient documentation

## 2014-07-03 DIAGNOSIS — Z880 Allergy status to penicillin: Secondary | ICD-10-CM | POA: Insufficient documentation

## 2014-07-03 DIAGNOSIS — M545 Low back pain, unspecified: Secondary | ICD-10-CM

## 2014-07-03 DIAGNOSIS — Z7952 Long term (current) use of systemic steroids: Secondary | ICD-10-CM | POA: Insufficient documentation

## 2014-07-03 DIAGNOSIS — F329 Major depressive disorder, single episode, unspecified: Secondary | ICD-10-CM | POA: Diagnosis not present

## 2014-07-03 DIAGNOSIS — Z792 Long term (current) use of antibiotics: Secondary | ICD-10-CM | POA: Diagnosis not present

## 2014-07-03 DIAGNOSIS — Z79899 Other long term (current) drug therapy: Secondary | ICD-10-CM | POA: Insufficient documentation

## 2014-07-03 MED ORDER — CYCLOBENZAPRINE HCL 10 MG PO TABS
10.0000 mg | ORAL_TABLET | Freq: Once | ORAL | Status: AC
  Administered 2014-07-03: 10 mg via ORAL
  Filled 2014-07-03: qty 1

## 2014-07-03 MED ORDER — NAPROXEN 500 MG PO TABS: 500.0000 mg | ORAL_TABLET | Freq: Two times a day (BID) | ORAL | Status: DC

## 2014-07-03 MED ORDER — HYDROCODONE-ACETAMINOPHEN 5-325 MG PO TABS
2.0000 | ORAL_TABLET | Freq: Once | ORAL | Status: AC
  Administered 2014-07-03: 2 via ORAL
  Filled 2014-07-03: qty 2

## 2014-07-03 MED ORDER — CYCLOBENZAPRINE HCL 10 MG PO TABS: 10.0000 mg | ORAL_TABLET | Freq: Two times a day (BID) | ORAL | Status: DC | PRN

## 2014-07-03 NOTE — ED Provider Notes (Signed)
CSN: [id number]     Arrival date & time 07/03/14  1454 History   First MD Initiated Contact with Patient 07/03/14 1606     Chief Complaint  Patient presents with  . Back Pain   Ernest Bowen is a 38 y.o. male with a hx of chronic low back pain who presents to the ED with his wife c/o low back pain worse today. Reports he was bending over in a weird position and threw his back out about 2 hours PTA. Reports his pain is constant across his low back and rates the pain at 5/10. States he feels like his back is in spasm. Reports taking an unknown amount of diclofenac around 2 pm today with little relief. Reports pain is worse with movement and bending. He denies numbness, tingling, loss of bowel or bladder control, fevers, chills, or weakness.    (Consider location/radiation/quality/duration/timing/severity/associated sxs/prior Treatment) Patient is a 38 y.o. male presenting with back pain. The history is provided by the patient.  Back Pain Location:  Lumbar spine Quality:  Aching Radiates to:  Does not radiate Pain severity:  Severe Onset quality:  Sudden Duration:  2 hours Timing:  Constant Progression:  Unchanged Chronicity:  New Context: not falling, not jumping from heights, not lifting heavy objects, not MVA, not occupational injury, not pedestrian accident, not recent illness, not recent injury and not twisting   Relieved by:  Being still Worsened by:  Movement, twisting and bending Ineffective treatments:  NSAIDs Associated symptoms: no abdominal pain, no abdominal swelling, no bladder incontinence, no bowel incontinence, no chest pain, no dysuria, no fever, no headaches, no leg pain, no numbness, no paresthesias, no pelvic pain, no perianal numbness, no tingling and no weakness     Past Medical History  Diagnosis Date  . Depression    Past Surgical History  Procedure Laterality Date  . Vasectomy     Family History  Problem Relation Age of Onset  . Colon cancer Maternal  Grandfather    History  Substance Use Topics  . Smoking status: Former Research scientist (life sciences)  . Smokeless tobacco: Current User    Types: Chew  . Alcohol Use: Yes    Review of Systems  Constitutional: Negative for fever and chills.  Respiratory: Negative for shortness of breath.   Cardiovascular: Negative for chest pain.  Gastrointestinal: Negative for nausea, abdominal pain and bowel incontinence.  Genitourinary: Negative for bladder incontinence, dysuria and pelvic pain.  Musculoskeletal: Positive for back pain.  Skin: Negative for rash.  Neurological: Negative for tingling, weakness, numbness, headaches and paresthesias.      Allergies  Penicillins  Home Medications   Prior to Admission medications   Medication Sig Start Date End Date Taking? Authorizing Provider  atomoxetine (STRATTERA) 80 MG capsule Take 1 capsule (80 mg total) by mouth daily. 05/06/12 05/06/13  Eulas Post, MD  cetirizine (ZYRTEC) 10 MG tablet Take 10 mg by mouth daily.    Historical Provider, MD  citalopram (CELEXA) 40 MG tablet TAKE ONE TABLET BY MOUTH EVERY DAY 05/31/13   Lisabeth Pick, MD  cyclobenzaprine (FLEXERIL) 10 MG tablet Take 1 tablet (10 mg total) by mouth 2 (two) times daily as needed for muscle spasms. 07/03/14   Verda Cumins Warnell Rasnic, PA  fluticasone (FLONASE) 50 MCG/ACT nasal spray Place 2 sprays into the nose daily. 06/16/12   Lucretia Kern, DO  guaiFENesin-codeine (ROBITUSSIN AC) 100-10 MG/5ML syrup Take 5 mLs by mouth at bedtime as needed for cough. 06/16/12   Nickola Major  Kim, DO  naproxen (NAPROSYN) 500 MG tablet Take 1 tablet (500 mg total) by mouth 2 (two) times daily with a meal. 07/03/14   Hanley Hays, PA  omeprazole (PRILOSEC) 20 MG capsule Take 1 capsule (20 mg total) by mouth daily. 05/06/12 05/06/13  Eulas Post, MD  omeprazole (PRILOSEC) 20 MG capsule TAKE ONE CAPSULE BY MOUTH EVERY DAY 06/02/12   Lisabeth Pick, MD  promethazine-codeine (PHENERGAN WITH CODEINE) 6.25-10 MG/5ML syrup  Take 5-10 mLs by mouth every 6 (six) hours as needed. 02/01/14   Roselee Culver, MD  pseudoephedrine-guaifenesin (MUCINEX D) 60-600 MG per tablet Take 1 tablet by mouth every 12 (twelve) hours. 02/01/14 02/01/15  Roselee Culver, MD  sulfamethoxazole-trimethoprim (BACTRIM DS,SEPTRA DS) 800-160 MG per tablet Take 1 tablet by mouth 2 (two) times daily. 06/16/12   Lucretia Kern, DO   BP 144/87 mmHg  Pulse 69  Temp(Src) 98.6 F (37 C) (Oral)  Resp 16  SpO2 97% Physical Exam  Constitutional: He appears well-developed and well-nourished. No distress.  HENT:  Head: Normocephalic and atraumatic.  Eyes: Right eye exhibits no discharge. Left eye exhibits no discharge.  Neck: Normal range of motion. Neck supple.  Cardiovascular: Normal rate, regular rhythm, normal heart sounds and intact distal pulses.  Exam reveals no gallop and no friction rub.   No murmur heard. Pulmonary/Chest: Effort normal and breath sounds normal. No respiratory distress.  Abdominal: Soft. There is no tenderness.  Musculoskeletal:  Tenderness to palpation generalized across his entire lumbar spine around L3-L4. No spinal process point tenderness. 5/5 strength in UE and LE. Negative straight leg raise. Sensation intact in lower extremities.  Patient able to ambulate without assistance.   Lymphadenopathy:    He has no cervical adenopathy.  Neurological: He is alert. He exhibits normal muscle tone. Coordination normal.  Skin: No rash noted. He is not diaphoretic.  Psychiatric: He has a normal mood and affect. His behavior is normal.  Nursing note and vitals reviewed.   ED Course  Procedures (including critical care time) Labs Review Labs Reviewed - No data to display  Imaging Review No results found.   EKG Interpretation None      Filed Vitals:   07/03/14 1507 07/03/14 1708  BP: 157/95 144/87  Pulse: 83 69  Temp: 98.3 F (36.8 C) 98.6 F (37 C)  TempSrc:  Oral  Resp: 18 16  SpO2: 98% 97%     MDM    Meds given in ED:  Medications  cyclobenzaprine (FLEXERIL) tablet 10 mg (10 mg Oral Given 07/03/14 1625)  HYDROcodone-acetaminophen (NORCO/VICODIN) 5-325 MG per tablet 2 tablet (2 tablets Oral Given 07/03/14 1625)    New Prescriptions   CYCLOBENZAPRINE (FLEXERIL) 10 MG TABLET    Take 1 tablet (10 mg total) by mouth 2 (two) times daily as needed for muscle spasms.   NAPROXEN (NAPROSYN) 500 MG TABLET    Take 1 tablet (500 mg total) by mouth 2 (two) times daily with a meal.    Final diagnoses:  Bilateral low back pain without sciatica   Ernest Bowen is a 38 y.o. male with a hx of low back pain who presented with his wife with acute low back pain after bending over in a weird position. After medications in the ED patient reports he is feeling better. Based on his exam and history we will discharge him with naproxen and flexeril.  He denies loss of bowel or bladder control or saddle anesthesia. He has a negative  straight leg raise. Patient is able to walk without assistance. Advised to not drive after his medications today in the ED. Advised to use caution when taking Flexeril and not to drive. Advised to follow-up with his PCP this week if he is still having pain. Advised to return to the ED if new or worsening symptoms or new concerns. Patient verbalized understanding and agreement with plan.      Hanley Hays, [place] 07/03/14 [phone]

## 2014-07-03 NOTE — Discharge Instructions (Signed)

## 2014-07-03 NOTE — ED Notes (Signed)
Pt having lower back pain that started todat after bending over. sts lower back.

## 2014-07-03 NOTE — ED Notes (Signed)
Declined W/C at D/C and was escorted to lobby by RN. 

## 2015-07-06 ENCOUNTER — Encounter: Payer: Self-pay | Admitting: Gastroenterology

## 2016-06-24 DIAGNOSIS — F331 Major depressive disorder, recurrent, moderate: Secondary | ICD-10-CM | POA: Diagnosis not present

## 2016-06-24 DIAGNOSIS — F411 Generalized anxiety disorder: Secondary | ICD-10-CM | POA: Diagnosis not present

## 2016-06-24 DIAGNOSIS — F902 Attention-deficit hyperactivity disorder, combined type: Secondary | ICD-10-CM | POA: Diagnosis not present

## 2016-07-19 DIAGNOSIS — F411 Generalized anxiety disorder: Secondary | ICD-10-CM | POA: Diagnosis not present

## 2016-09-28 DIAGNOSIS — J209 Acute bronchitis, unspecified: Secondary | ICD-10-CM | POA: Diagnosis not present

## 2016-10-04 DIAGNOSIS — R05 Cough: Secondary | ICD-10-CM | POA: Diagnosis not present

## 2016-10-04 DIAGNOSIS — J069 Acute upper respiratory infection, unspecified: Secondary | ICD-10-CM | POA: Diagnosis not present

## 2017-01-03 DIAGNOSIS — D225 Melanocytic nevi of trunk: Secondary | ICD-10-CM | POA: Diagnosis not present

## 2017-01-03 DIAGNOSIS — L821 Other seborrheic keratosis: Secondary | ICD-10-CM | POA: Diagnosis not present

## 2017-01-03 DIAGNOSIS — L918 Other hypertrophic disorders of the skin: Secondary | ICD-10-CM | POA: Diagnosis not present

## 2017-01-14 ENCOUNTER — Emergency Department (HOSPITAL_COMMUNITY)
Admission: EM | Admit: 2017-01-14 | Discharge: 2017-01-14 | Disposition: A | Discharge status: Home / Self Care | Payer: BLUE CROSS/BLUE SHIELD | Attending: Dermatology | Admitting: Dermatology

## 2017-01-14 ENCOUNTER — Encounter (HOSPITAL_COMMUNITY): Payer: Self-pay | Admitting: Emergency Medicine

## 2017-01-14 DIAGNOSIS — Z5321 Procedure and treatment not carried out due to patient leaving prior to being seen by health care provider: Secondary | ICD-10-CM | POA: Insufficient documentation

## 2017-01-14 DIAGNOSIS — K644 Residual hemorrhoidal skin tags: Secondary | ICD-10-CM | POA: Diagnosis not present

## 2017-01-14 NOTE — ED Notes (Signed)
Patient informed Rollene Fare, [place] that he was leaving due to the long wait. Pt was alert, oriented x 4, and appeared in no acute respiratory distress. After finishing up with another patient to room but patient had left and gown was found on the stretcher.

## 2017-01-14 NOTE — ED Triage Notes (Signed)
Pt comes in with complaints of an external hemorrhoid. Today patient reports coming home and he had some blood on his pants.  Also reports back pain that began 2 days ago. Denies any injury.  Endorses lifting weights but states he does that all of the time. Has used cream at home and could feel them as he applied it. Reports also taking probiotics twice a week.  Ambulatory and A&O x4.

## 2017-01-17 DIAGNOSIS — K649 Unspecified hemorrhoids: Secondary | ICD-10-CM | POA: Diagnosis not present

## 2017-01-17 DIAGNOSIS — M545 Low back pain: Secondary | ICD-10-CM | POA: Diagnosis not present

## 2017-01-24 DIAGNOSIS — F411 Generalized anxiety disorder: Secondary | ICD-10-CM | POA: Diagnosis not present

## 2017-07-01 DIAGNOSIS — Z23 Encounter for immunization: Secondary | ICD-10-CM | POA: Diagnosis not present

## 2017-10-20 DIAGNOSIS — F4321 Adjustment disorder with depressed mood: Secondary | ICD-10-CM | POA: Diagnosis not present

## 2017-11-11 DIAGNOSIS — F4321 Adjustment disorder with depressed mood: Secondary | ICD-10-CM | POA: Diagnosis not present

## 2017-11-25 DIAGNOSIS — F4321 Adjustment disorder with depressed mood: Secondary | ICD-10-CM | POA: Diagnosis not present

## 2018-09-10 ENCOUNTER — Ambulatory Visit (HOSPITAL_COMMUNITY)
Admission: EM | Admit: 2018-09-10 | Discharge: 2018-09-10 | Disposition: A | Discharge status: Home / Self Care | Payer: BLUE CROSS/BLUE SHIELD | Attending: Family Medicine | Admitting: Family Medicine

## 2018-09-10 ENCOUNTER — Encounter (HOSPITAL_COMMUNITY): Payer: Self-pay | Admitting: Emergency Medicine

## 2018-09-10 ENCOUNTER — Other Ambulatory Visit: Payer: Self-pay

## 2018-09-10 DIAGNOSIS — L089 Local infection of the skin and subcutaneous tissue, unspecified: Secondary | ICD-10-CM | POA: Insufficient documentation

## 2018-09-10 DIAGNOSIS — B9689 Other specified bacterial agents as the cause of diseases classified elsewhere: Secondary | ICD-10-CM | POA: Diagnosis not present

## 2018-09-10 NOTE — ED Triage Notes (Signed)
Pt. Stated, I stepped on a nail in my left foot. It was 2 weeks ago and it still hurts. I want to make sure I don't have a staph infection.

## 2018-09-15 NOTE — ED Provider Notes (Signed)
[facility]   [id number] 09/10/18 Arrival Time: 1932  ASSESSMENT & PLAN:  1. Superficial bacterial skin infection    Incision and Drainage Procedure Note  Anesthesia: ethyl chloride spray Procedure Details  The procedure, risks and complications have been discussed in detail (including, but not limited to pain and bleeding) with the patient.  The skin induration was prepped and draped in the usual fashion. After adequate local anesthesia, I&D with a #11 blade was performed on the left distal plantar foot. Purulent drainage: present.  EBL: minimal Drains: none Condition: Tolerated procedure well Complications: none.  Watch for s/s infection. No antibiotics at this time. OTC analgesics if needed.  Reviewed expectations re: course of current medical issues. Questions answered. Outlined signs and symptoms indicating need for more acute intervention. Patient verbalized understanding. After Visit Summary given.   SUBJECTIVE:  Ernest Bowen is a 43 y.o. male who presents with a possible infection of his distal plantar L foot. Onset gradual, approximately 2 weeks ago. Reports stepping on a nail that went through his shoe. Gradual onset of "a swollen area where the nail went in". Stable now. No drainage. Ambulatory but hurts his L foot. Afebrile. No OTC/home treatment.  ROS: As per HPI.  OBJECTIVE:  Vitals:   09/10/18 1948 09/10/18 1950  BP:  135/80  Pulse:  93  Resp:  17  Temp:  98.1 F (36.7 C)  TempSrc:  Oral  SpO2:  96%  Weight: 97.5 kg   Height: 5\' 9"  (1.753 m)      General appearance: alert; no distress Skin: 0.5 cm induration of his distal planter L foot; tender to touch; no active drainage or bleeding Psychological: alert and cooperative; normal mood and affect  Allergies  Allergen Reactions  . Penicillins Rash    Has patient had a PCN reaction causing immediate rash, facial/tongue/throat swelling, SOB or lightheadedness with hypotension: No Has  patient had a PCN reaction causing severe rash involving mucus membranes or skin necrosis: No Has patient had a PCN reaction that required hospitalization No Has patient had a PCN reaction occurring within the last 10 years: No If all of the above answers are "NO", then may proceed with Cephalosporin use.     Past Medical History:  Diagnosis Date  . Depression    Social History   Socioeconomic History  . Marital status: Married    Spouse name: Not on file  . Number of children: Not on file  . Years of education: Not on file  . Highest education level: Not on file  Occupational History  . Not on file  Social Needs  . Financial resource strain: Not on file  . Food insecurity:    Worry: Not on file    Inability: Not on file  . Transportation needs:    Medical: Not on file    Non-medical: Not on file  Tobacco Use  . Smoking status: Former Research scientist (life sciences)  . Smokeless tobacco: Current User    Types: Chew  Substance and Sexual Activity  . Alcohol use: Yes  . Drug use: No  . Sexual activity: Not on file  Lifestyle  . Physical activity:    Days per week: Not on file    Minutes per session: Not on file  . Stress: Not on file  Relationships  . Social connections:    Talks on phone: Not on file    Gets together: Not on file    Attends religious service: Not on file    Active  member of club or organization: Not on file    Attends meetings of clubs or organizations: Not on file    Relationship status: Not on file  Other Topics Concern  . Not on file  Social History Narrative  . Not on file   Family History  Problem Relation Age of Onset  . Colon cancer Maternal Grandfather    Past Surgical History:  Procedure Laterality Date  . Lady Deutscher, MD 09/15/18 [phone]

## 2018-10-20 ENCOUNTER — Encounter: Payer: Self-pay | Admitting: Emergency Medicine

## 2018-10-20 DIAGNOSIS — F3342 Major depressive disorder, recurrent, in full remission: Secondary | ICD-10-CM | POA: Insufficient documentation

## 2018-10-20 DIAGNOSIS — F411 Generalized anxiety disorder: Secondary | ICD-10-CM | POA: Insufficient documentation

## 2019-02-25 ENCOUNTER — Ambulatory Visit: Payer: Self-pay | Admitting: Psychiatry

## 2019-03-17 ENCOUNTER — Ambulatory Visit (INDEPENDENT_AMBULATORY_CARE_PROVIDER_SITE_OTHER): Payer: BC Managed Care – PPO | Admitting: Psychiatry

## 2019-03-17 ENCOUNTER — Other Ambulatory Visit: Payer: Self-pay

## 2019-03-17 ENCOUNTER — Encounter: Payer: Self-pay | Admitting: Psychiatry

## 2019-03-17 DIAGNOSIS — G4700 Insomnia, unspecified: Secondary | ICD-10-CM | POA: Diagnosis not present

## 2019-03-17 DIAGNOSIS — F909 Attention-deficit hyperactivity disorder, unspecified type: Secondary | ICD-10-CM | POA: Diagnosis not present

## 2019-03-17 DIAGNOSIS — F411 Generalized anxiety disorder: Secondary | ICD-10-CM | POA: Diagnosis not present

## 2019-03-17 MED ORDER — ATOMOXETINE HCL 80 MG PO CAPS: 80.0000 mg | ORAL_CAPSULE | Freq: Every day | ORAL | 3 refills | Status: DC

## 2019-03-17 MED ORDER — TRAZODONE HCL 50 MG PO TABS: 50.0000 mg | ORAL_TABLET | Freq: Every day | ORAL | 3 refills | Status: DC

## 2019-03-17 MED ORDER — SERTRALINE HCL 100 MG PO TABS: 150.0000 mg | ORAL_TABLET | Freq: Every morning | ORAL | 3 refills | Status: DC

## 2019-03-17 NOTE — Progress Notes (Signed)
Ernest Bowen [id number] 09-08-1975 43 y.o.  Virtual Visit via Telephone Note  I connected with pt on 03/17/19 at  9:00 AM EDT by telephone and verified that I am speaking with the correct person using two identifiers.   I discussed the limitations, risks, security and privacy concerns of performing an evaluation and management service by telephone and the availability of in person appointments. I also discussed with the patient that there may be a patient responsible charge related to this service. The patient expressed understanding and agreed to proceed.   I discussed the assessment and treatment plan with the patient. The patient was provided an opportunity to ask questions and all were answered. The patient agreed with the plan and demonstrated an understanding of the instructions.   The patient was advised to call back or seek an in-person evaluation if the symptoms worsen or if the condition fails to improve as anticipated.  I provided 25 minutes of non-face-to-face time during this encounter.  The patient was located at home.  The provider was located at [place].   Thayer Headings, PMHNP   Subjective:   Patient ID:  Ernest Bowen is a 43 y.o. (DOB 13-Nov-1975) male.  Chief Complaint:  Chief Complaint  Patient presents with  . Follow-up    h/o depression, anxiety, ADD, insomnia    HPI Ernest Bowen presents for follow-up of h/o anxiety, depression, insomnia, and ADD. 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 He reports some stress with recent changes and that anxiety has been manageable. He reports that he reports "I think I am in a good place... considering circumstances." He reports that mood has been stable. He reports adequate sleep and averaging about 6 hours. He reports that his appetite has been good and has gained some weight. He reports that his energy and motivation  have been good. He reports adequate concentration and has been able to manage multiple tasks and demands. Denies SI.   He reports that he and his family have been spending more time together and doing some outdoor activities.   Past Psychiatric Medication Trials: Sertraline-effective.  Some sexual side effects with increased to 150 mg daily Celexa-initially effective and was then no longer as effective for signs and symptoms. BuSpar-ineffective Trazodone-effective for insomnia Strattera-effective Klonopin Chantix-increased anxiety and irritability  Review of Systems:  Review of Systems  Gastrointestinal: Negative.   Musculoskeletal: Negative for gait problem.  Neurological: Negative for tremors.  Psychiatric/Behavioral:       Please refer to HPI    Medications: I have reviewed the patient's current medications.  Current Outpatient Medications  Medication Sig Dispense Refill  . sertraline (ZOLOFT) 100 MG tablet Take 1.5 tablets (150 mg total) by mouth every morning. 135 tablet 3  . traZODone (DESYREL) 50 MG tablet Take 1 tablet (50 mg total) by mouth at bedtime. 90 tablet 3  . atomoxetine (STRATTERA) 80 MG capsule Take 1 capsule (80 mg total) by mouth daily. 90 capsule 3  . omeprazole (PRILOSEC) 20 MG capsule TAKE ONE CAPSULE BY MOUTH EVERY DAY (Patient not taking: Reported on 03/17/2019) 90 capsule 2   No current facility-administered medications for this visit.     Medication Side Effects: Other: Occ sexual side effects in the last 6 months.   Allergies:  Allergies  Allergen Reactions  . Penicillins Rash    Has patient had a PCN reaction causing immediate rash, facial/tongue/throat swelling, SOB  or lightheadedness with hypotension: No Has patient had a PCN reaction causing severe rash involving mucus membranes or skin necrosis: No Has patient had a PCN reaction that required hospitalization No Has patient had a PCN reaction occurring within the last 10 years: No If all of  the above answers are "NO", then may proceed with Cephalosporin use.     Past Medical History:  Diagnosis Date  . Allergic rhinitis   . Depression   . GERD (gastroesophageal reflux disease)     Family History  Problem Relation Age of Onset  . Colon cancer Maternal Grandfather     Social History   Socioeconomic History  . Marital status: Married    Spouse name: Not on file  . Number of children: Not on file  . Years of education: Not on file  . Highest education level: Not on file  Occupational History  . Not on file  Social Needs  . Financial resource strain: Not on file  . Food insecurity    Worry: Not on file    Inability: Not on file  . Transportation needs    Medical: Not on file    Non-medical: Not on file  Tobacco Use  . Smoking status: Former Research scientist (life sciences)  . Smokeless tobacco: Former Systems developer    Types: Chew  Substance and Sexual Activity  . Alcohol use: Yes  . Drug use: No  . Sexual activity: Not on file  Lifestyle  . Physical activity    Days per week: Not on file    Minutes per session: Not on file  . Stress: Not on file  Relationships  . Social Herbalist on phone: Not on file    Gets together: Not on file    Attends religious service: Not on file    Active member of club or organization: Not on file    Attends meetings of clubs or organizations: Not on file    Relationship status: Not on file  . Intimate partner violence    Fear of current or ex partner: Not on file    Emotionally abused: Not on file    Physically abused: Not on file    Forced sexual activity: Not on file  Other Topics Concern  . Not on file  Social History Narrative  . Not on file    Past Medical History, Surgical history, Social history, and Family history were reviewed and updated as appropriate.   Please see review of systems for further details on the patient's review from today.   Objective:   Physical Exam:  There were no vitals taken for this  visit.  Physical Exam Neurological:     Mental Status: He is alert and oriented to person, place, and time.     Cranial Nerves: No dysarthria.  Psychiatric:        Attention and Perception: Attention normal.        Mood and Affect: Mood normal.        Speech: Speech normal.        Behavior: Behavior is cooperative.        Thought Content: Thought content normal. Thought content is not paranoid or delusional. Thought content does not include homicidal or suicidal ideation. Thought content does not include homicidal or suicidal plan.        Cognition and Memory: Cognition and memory normal.        Judgment: Judgment normal.     Lab Review:     Component Value  Date/Time   NA 136 11/22/2010 0822   K 4.7 11/22/2010 0822   CL 101 11/22/2010 0822   CO2 29 11/22/2010 0822   GLUCOSE 84 11/22/2010 0822   GLUCOSE 94 09/03/2006 1138   BUN 19 11/22/2010 0822   CREATININE 1.0 11/22/2010 0822   CALCIUM 9.0 11/22/2010 0822   PROT 6.5 11/22/2010 0822   ALBUMIN 4.2 11/22/2010 0822   AST 29 11/22/2010 0822   ALT 19 11/22/2010 0822   ALKPHOS 60 11/22/2010 0822   BILITOT 0.6 11/22/2010 0822   GFRNONAA 91.39 10/31/2009 0850   GFRAA 144 10/11/2008 1027       Component Value Date/Time   WBC 6.1 11/22/2010 0822   RBC 4.70 11/22/2010 0822   HGB 14.5 11/22/2010 0822   HCT 42.9 11/22/2010 0822   PLT 228.0 11/22/2010 0822   MCV 91.4 11/22/2010 0822   MCHC 33.8 11/22/2010 0822   RDW 12.4 11/22/2010 0822   LYMPHSABS 2.1 11/22/2010 0822   MONOABS 0.5 11/22/2010 0822   EOSABS 0.3 11/22/2010 0822   BASOSABS 0.0 11/22/2010 0822    No results found for: POCLITH, LITHIUM   No results found for: PHENYTOIN, PHENOBARB, VALPROATE, CBMZ   .res Assessment: Plan:   Discussed that sexual side effects are likely dose related and seem to have started when dose of sertraline was increased from 100 mg to 150 mg approximately 1 year ago.  Discussed option of dose reduction of sertraline at this time or in  the future when situational stress is less if sexual side effects are problematic.  Also discussed option of adding medication such as Viagra as needed.  Patient reports that currently potential benefits are outweighing potential risks and would like to continue sertraline 150 mg daily for depression and anxiety.  Patient reports that he will contact office if he would like to try dose reduction before next appointment.  We will therefore continue all medications as prescribed. Patient to follow-up in 1 year or sooner if clinically indicated. Patient advised to contact office with any questions, adverse effects, or acute worsening in signs and symptoms.  Ernest Bowen was seen today for follow-up.  Diagnoses and all orders for this visit:  Generalized anxiety disorder -     sertraline (ZOLOFT) 100 MG tablet; Take 1.5 tablets (150 mg total) by mouth every morning.  Insomnia, unspecified type -     traZODone (DESYREL) 50 MG tablet; Take 1 tablet (50 mg total) by mouth at bedtime.  Attention deficit hyperactivity disorder (ADHD), unspecified ADHD type -     atomoxetine (STRATTERA) 80 MG capsule; Take 1 capsule (80 mg total) by mouth daily.     Please see After Visit Summary for patient specific instructions.  No future appointments.  No orders of the defined types were placed in this encounter.     -------------------------------

## 2019-09-01 ENCOUNTER — Ambulatory Visit: Payer: BC Managed Care – PPO | Attending: Internal Medicine

## 2019-09-01 DIAGNOSIS — Z20822 Contact with and (suspected) exposure to covid-19: Secondary | ICD-10-CM

## 2019-09-03 LAB — NOVEL CORONAVIRUS, NAA: SARS-CoV-2, NAA: NOT DETECTED

## 2019-11-07 ENCOUNTER — Ambulatory Visit: Payer: BC Managed Care – PPO | Attending: Internal Medicine

## 2019-11-07 DIAGNOSIS — Z23 Encounter for immunization: Secondary | ICD-10-CM | POA: Insufficient documentation

## 2019-11-07 NOTE — Progress Notes (Signed)
   Covid-19 Vaccination Clinic  Name:  Eswin Tellinghuisen    MRN: [id number] DOB: 1975-10-17  11/07/2019  Mr. Stonge was observed post Covid-19 immunization for 15 minutes without incident. He was provided with Vaccine Information Sheet and instruction to access the V-Safe system.   Mr. Bohning was instructed to call 911 with any severe reactions post vaccine: Marland Kitchen Difficulty breathing  . Swelling of face and throat  . A fast heartbeat  . A bad rash all over body  . Dizziness and weakness   Immunizations Administered    Name Date Dose VIS Date Route   Pfizer COVID-19 Vaccine 11/07/2019 10:01 AM 0.3 mL 08/13/2019 Intramuscular   Manufacturer: [place]   Lot: [id number]   [place]: [id number]

## 2019-12-08 ENCOUNTER — Ambulatory Visit: Payer: BC Managed Care – PPO | Attending: Internal Medicine

## 2019-12-08 DIAGNOSIS — Z23 Encounter for immunization: Secondary | ICD-10-CM

## 2019-12-08 NOTE — Progress Notes (Signed)
   Covid-19 Vaccination Clinic  Name:  Ernest Bowen    MRN: [id number] DOB: Jan 28, 1976  12/08/2019  [name] was observed post Covid-19 immunization for 15 minutes without incident. He was provided with Vaccine Information Sheet and instruction to access the V-Safe system.   [name] was instructed to call 911 with any severe reactions post vaccine: Marland Kitchen Difficulty breathing  . Swelling of face and throat  . A fast heartbeat  . A bad rash all over body  . Dizziness and weakness   Immunizations Administered    Name Date Dose VIS Date Route   Pfizer COVID-19 Vaccine 12/08/2019  8:42 AM 0.3 mL 08/13/2019 Intramuscular   Manufacturer: [place]   Lot: [id number]   [place]: [id number]

## 2020-02-15 ENCOUNTER — Other Ambulatory Visit: Payer: Self-pay

## 2020-02-15 DIAGNOSIS — F909 Attention-deficit hyperactivity disorder, unspecified type: Secondary | ICD-10-CM

## 2020-02-15 MED ORDER — ATOMOXETINE HCL 80 MG PO CAPS: 80.0000 mg | ORAL_CAPSULE | Freq: Every day | ORAL | 0 refills | Status: DC

## 2020-02-25 ENCOUNTER — Other Ambulatory Visit: Payer: Self-pay

## 2020-02-25 DIAGNOSIS — F411 Generalized anxiety disorder: Secondary | ICD-10-CM

## 2020-02-25 MED ORDER — SERTRALINE HCL 100 MG PO TABS: 150.0000 mg | ORAL_TABLET | Freq: Every morning | ORAL | 0 refills | Status: DC

## 2020-03-23 ENCOUNTER — Telehealth: Payer: Self-pay | Admitting: Psychiatry

## 2020-03-23 NOTE — Telephone Encounter (Signed)
Pharmacy, [facility], called to check status of refill for Ernest Bowen's Trazodone.  Said they escribed and faxed it yesterday.  Asking for 90 day.  He has appt 04/12/20.

## 2020-03-24 ENCOUNTER — Other Ambulatory Visit: Payer: Self-pay

## 2020-03-24 DIAGNOSIS — G4700 Insomnia, unspecified: Secondary | ICD-10-CM

## 2020-03-24 MED ORDER — TRAZODONE HCL 50 MG PO TABS: 50.0000 mg | ORAL_TABLET | Freq: Every day | ORAL | 0 refills | Status: DC

## 2020-03-24 NOTE — Telephone Encounter (Signed)
Rx sent 

## 2020-03-29 ENCOUNTER — Other Ambulatory Visit: Payer: Self-pay | Admitting: Psychiatry

## 2020-03-29 DIAGNOSIS — G4700 Insomnia, unspecified: Secondary | ICD-10-CM

## 2020-04-12 ENCOUNTER — Other Ambulatory Visit: Payer: Self-pay

## 2020-04-12 ENCOUNTER — Encounter: Payer: Self-pay | Admitting: Psychiatry

## 2020-04-12 ENCOUNTER — Ambulatory Visit (INDEPENDENT_AMBULATORY_CARE_PROVIDER_SITE_OTHER): Payer: BC Managed Care – PPO | Admitting: Psychiatry

## 2020-04-12 DIAGNOSIS — G4700 Insomnia, unspecified: Secondary | ICD-10-CM | POA: Diagnosis not present

## 2020-04-12 DIAGNOSIS — F411 Generalized anxiety disorder: Secondary | ICD-10-CM | POA: Diagnosis not present

## 2020-04-12 DIAGNOSIS — F909 Attention-deficit hyperactivity disorder, unspecified type: Secondary | ICD-10-CM

## 2020-04-12 MED ORDER — ATOMOXETINE HCL 80 MG PO CAPS: 80.0000 mg | ORAL_CAPSULE | Freq: Every day | ORAL | 3 refills | Status: DC

## 2020-04-12 MED ORDER — SILDENAFIL CITRATE 50 MG PO TABS: 50.0000 mg | ORAL_TABLET | Freq: Every day | ORAL | 5 refills | Status: DC | PRN

## 2020-04-12 MED ORDER — SERTRALINE HCL 100 MG PO TABS: 150.0000 mg | ORAL_TABLET | Freq: Every morning | ORAL | 3 refills | Status: DC

## 2020-04-12 MED ORDER — TRAZODONE HCL 50 MG PO TABS: ORAL_TABLET | ORAL | 3 refills | Status: DC

## 2020-04-12 NOTE — Progress Notes (Signed)
Ernest Bowen [id number] 09/14/1975 44 y.o.  Subjective:   Patient ID:  Ernest Bowen is a 44 y.o. (DOB Nov 17, 1975) male.  Chief Complaint:  Chief Complaint  Patient presents with   Follow-up    h/o ADD, anxiety    HPI Ernest Bowen presents to the office today for follow-up of anxiety and ADHD. He reports that he has been doing well overall. He reports that his work has been going well. 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 He reports that with demand for COVID testing he was working up to 80 hours a week.  He reports that he has been managing his stress. He reports that he has not had any recent panic attacks or anxiety that has been unmanageable.  He reports approximately a 15-20 lb weight gain. He reports that he plans to try to lose weight and be more active. He denies any persistent depressed mood. He reports that he was taking Trazodone 50 mg 1-1.5 tabs po QHS and has been taking about 1 tab po QHS. He reports that sleep has been adequate and is intentional about getting sleep.  He reports that he tried to reduce Sertraline to 100 mg po QHS and his wife noticed that he was more "moody" and he went back up to 150 mg po QHS. Concentration has been adequate. Energy and motivation have been good. Denies SI.      Past Psychiatric Medication Trials: Sertraline-effective.  Some sexual side effects with increased to 150 mg daily Celexa-initially effective and was then no longer as effective for signs and symptoms. BuSpar-ineffective Trazodone-effective for insomnia Strattera-effective Klonopin Chantix-increased anxiety and irritability  Review of Systems:  Review of Systems  Cardiovascular: Negative for palpitations.  Musculoskeletal: Negative for gait problem.  Neurological: Negative for tremors.  Psychiatric/Behavioral:       Please refer to HPI    Medications: I have reviewed the patient's current medications.  Current  Outpatient Medications  Medication Sig Dispense Refill   atomoxetine (STRATTERA) 80 MG capsule Take 1 capsule (80 mg total) by mouth daily. 90 capsule 3   omeprazole (PRILOSEC) 20 MG capsule TAKE ONE CAPSULE BY MOUTH EVERY DAY 90 capsule 2   sertraline (ZOLOFT) 100 MG tablet Take 1.5 tablets (150 mg total) by mouth every morning. 135 tablet 3   sildenafil (VIAGRA) 50 MG tablet Take 1 tablet (50 mg total) by mouth daily as needed for erectile dysfunction. 10 tablet 5   traZODone (DESYREL) 50 MG tablet Take 1-1.5 tabs po QHS 135 tablet 3   No current facility-administered medications for this visit.    Medication Side Effects: Other: Possible Sexual side effects  Allergies:  Allergies  Allergen Reactions   Penicillins Rash    Has patient had a PCN reaction causing immediate rash, facial/tongue/throat swelling, SOB or lightheadedness with hypotension: No Has patient had a PCN reaction causing severe rash involving mucus membranes or skin necrosis: No Has patient had a PCN reaction that required hospitalization No Has patient had a PCN reaction occurring within the last 10 years: No If all of the above answers are "NO", then may proceed with Cephalosporin use.     Past Medical History:  Diagnosis Date   Allergic rhinitis    Depression    GERD (gastroesophageal reflux disease)     Family History  Problem Relation Age of Onset   Colon cancer Maternal Grandfather     Social History   Socioeconomic History   Marital status: Married  Spouse name: Not on file   Number of children: Not on file   Years of education: Not on file   Highest education level: Not on file  Occupational History   Not on file  Tobacco Use   Smoking status: Former Smoker   Smokeless tobacco: Former Systems developer    Types: Chew  Substance and Sexual Activity   Alcohol use: Yes   Drug use: No   Sexual activity: Not on file  Other Topics Concern   Not on file  Social History Narrative    Not on file   Social Determinants of Health   Financial Resource Strain:    Difficulty of Paying Living Expenses:   Food Insecurity:    Worried About Charity fundraiser in the Last Year:    Arboriculturist in the Last Year:   Transportation Needs:    Film/video editor (Medical):    Lack of Transportation (Non-Medical):   Physical Activity:    Days of Exercise per Week:    Minutes of Exercise per Session:   Stress:    Feeling of Stress :   Social Connections:    Frequency of Communication with Friends and Family:    Frequency of Social Gatherings with Friends and Family:    Attends Religious Services:    Active Member of Clubs or Organizations:    Attends Music therapist:    Marital Status:   Intimate Partner Violence:    Fear of Current or Ex-Partner:    Emotionally Abused:    Physically Abused:    Sexually Abused:     Past Medical History, Surgical history, Social history, and Family history were reviewed and updated as appropriate.   Please see review of systems for further details on the patient's review from today.   Objective:   Physical Exam:  BP (!) 148/85    Pulse 65   Physical Exam Constitutional:      General: He is not in acute distress. Musculoskeletal:        General: No deformity.  Neurological:     Mental Status: He is alert and oriented to person, place, and time.     Coordination: Coordination normal.  Psychiatric:        Attention and Perception: Attention and perception normal. He does not perceive auditory or visual hallucinations.        Mood and Affect: Mood normal. Mood is not anxious or depressed. Affect is not labile, blunt, angry or inappropriate.        Speech: Speech normal.        Behavior: Behavior normal.        Thought Content: Thought content normal. Thought content is not paranoid or delusional. Thought content does not include homicidal or suicidal ideation. Thought content does not  include homicidal or suicidal plan.        Cognition and Memory: Cognition and memory normal.        Judgment: Judgment normal.     Comments: Insight intact     Lab Review:     Component Value Date/Time   NA 136 11/22/2010 0822   K 4.7 11/22/2010 0822   CL 101 11/22/2010 0822   CO2 29 11/22/2010 0822   GLUCOSE 84 11/22/2010 0822   GLUCOSE 94 09/03/2006 1138   BUN 19 11/22/2010 0822   CREATININE 1.0 11/22/2010 0822   CALCIUM 9.0 11/22/2010 0822   PROT 6.5 11/22/2010 0822   ALBUMIN 4.2 11/22/2010 0822   AST  29 11/22/2010 0822   ALT 19 11/22/2010 0822   ALKPHOS 60 11/22/2010 0822   BILITOT 0.6 11/22/2010 0822   GFRNONAA 91.39 10/31/2009 0850   GFRAA 144 10/11/2008 1027       Component Value Date/Time   WBC 6.1 11/22/2010 0822   RBC 4.70 11/22/2010 0822   HGB 14.5 11/22/2010 0822   HCT 42.9 11/22/2010 0822   PLT 228.0 11/22/2010 0822   MCV 91.4 11/22/2010 0822   MCHC 33.8 11/22/2010 0822   RDW 12.4 11/22/2010 0822   LYMPHSABS 2.1 11/22/2010 0822   MONOABS 0.5 11/22/2010 0822   EOSABS 0.3 11/22/2010 0822   BASOSABS 0.0 11/22/2010 0822    No results found for: POCLITH, LITHIUM   No results found for: PHENYTOIN, PHENOBARB, VALPROATE, CBMZ   .res Assessment: Plan:   Will continue current plan of care since target signs and symptoms are well controlled without any tolerability issues. Will add Viagra prn for possible sexual side effects. Continue Sertraline 150 mg po qd for anxiety.  Continue Strattera 80 mg po qd for ADD. Continue Trazodone for insomnia. Will change qty to 1-1.5 tabs po QHS so that he has option to take 50-75 mg as he has taken in the past. Pt to f/u in 1 year or sooner if clinically indicated.  Patient advised to contact office with any questions, adverse effects, or acute worsening in signs and symptoms.   Alucard was seen today for follow-up.  Diagnoses and all orders for this visit:  Generalized anxiety disorder -     sertraline (ZOLOFT) 100  MG tablet; Take 1.5 tablets (150 mg total) by mouth every morning.  Insomnia, unspecified type -     traZODone (DESYREL) 50 MG tablet; Take 1-1.5 tabs po QHS  Attention deficit hyperactivity disorder (ADHD), unspecified ADHD type -     atomoxetine (STRATTERA) 80 MG capsule; Take 1 capsule (80 mg total) by mouth daily.  Other orders -     sildenafil (VIAGRA) 50 MG tablet; Take 1 tablet (50 mg total) by mouth daily as needed for erectile dysfunction.     Please see After Visit Summary for patient specific instructions.  No future appointments.  No orders of the defined types were placed in this encounter.   -------------------------------

## 2020-05-24 DIAGNOSIS — F4321 Adjustment disorder with depressed mood: Secondary | ICD-10-CM | POA: Diagnosis not present

## 2020-05-31 DIAGNOSIS — F4321 Adjustment disorder with depressed mood: Secondary | ICD-10-CM | POA: Diagnosis not present

## 2020-06-14 DIAGNOSIS — F4321 Adjustment disorder with depressed mood: Secondary | ICD-10-CM | POA: Diagnosis not present

## 2020-06-29 DIAGNOSIS — F4321 Adjustment disorder with depressed mood: Secondary | ICD-10-CM | POA: Diagnosis not present

## 2020-07-06 DIAGNOSIS — F4321 Adjustment disorder with depressed mood: Secondary | ICD-10-CM | POA: Diagnosis not present

## 2020-07-20 DIAGNOSIS — F4321 Adjustment disorder with depressed mood: Secondary | ICD-10-CM | POA: Diagnosis not present

## 2020-09-08 DIAGNOSIS — F4321 Adjustment disorder with depressed mood: Secondary | ICD-10-CM | POA: Diagnosis not present

## 2020-09-22 DIAGNOSIS — F4321 Adjustment disorder with depressed mood: Secondary | ICD-10-CM | POA: Diagnosis not present

## 2020-10-06 DIAGNOSIS — F4321 Adjustment disorder with depressed mood: Secondary | ICD-10-CM | POA: Diagnosis not present

## 2020-10-20 DIAGNOSIS — F4321 Adjustment disorder with depressed mood: Secondary | ICD-10-CM | POA: Diagnosis not present

## 2020-11-03 DIAGNOSIS — F4321 Adjustment disorder with depressed mood: Secondary | ICD-10-CM | POA: Diagnosis not present

## 2021-02-23 ENCOUNTER — Encounter: Payer: Self-pay | Admitting: Gastroenterology

## 2021-03-08 ENCOUNTER — Other Ambulatory Visit: Payer: Self-pay | Admitting: Psychiatry

## 2021-03-08 DIAGNOSIS — F411 Generalized anxiety disorder: Secondary | ICD-10-CM

## 2021-03-14 NOTE — Telephone Encounter (Signed)
Pt left a message asking on the status of his sertraline. Hew has an appt for 04/12/21 Please fill

## 2021-03-14 NOTE — Telephone Encounter (Signed)
Pt called checking status on refill for Sertraline 1.5 tablet (150mg  total) every morning. [facility] sent request 7/7. Pt is out of meds and has Apt 8/11

## 2021-04-09 ENCOUNTER — Ambulatory Visit: Payer: BC Managed Care – PPO | Admitting: Gastroenterology

## 2021-04-09 ENCOUNTER — Encounter: Payer: Self-pay | Admitting: Gastroenterology

## 2021-04-09 VITALS — BP 116/80 | HR 72 | Ht 68.5 in | Wt 248.1 lb

## 2021-04-09 DIAGNOSIS — K200 Eosinophilic esophagitis: Secondary | ICD-10-CM | POA: Diagnosis not present

## 2021-04-09 DIAGNOSIS — Z8371 Family history of colonic polyps: Secondary | ICD-10-CM

## 2021-04-09 NOTE — Progress Notes (Signed)
Referring Provider: Derinda Late, MD Primary Care Physician:  Derinda Late, MD  Reason for Consultation:  Change in bowel habits, blood in the stool   IMPRESSION:  Family history of colon polyps (brother and father) Intermittent rectal bleeding previously attributed to hemorrhoids History of GERD versus eosinophilic esophagitis    - esophageal eosinophils on EGD 2010, not quantified Lactose intolerance    PLAN: EGD with biopsy to follow-up on possible diagnosis of eosinophilic esophagitis Colonoscopy for colon cancer screening  Please see the "Patient Instructions" section for addition details about the plan.  HPI: Ernest Bowen is a 45 y.o. male self-referred for change in bowel habits and intermittent BRB on the toilet paper that he attributes to hemorrhoids. He feels he may be lactose intolerant as the constipation worsens with exposure to lactose.  The history is obtained through the patient and review of his electronic health record.   Having one formed bowel movement daily.    EGD with Dr. Ardis Hughs 2010 for reflux and dysphagia showed a ringed esophagus. Esophageal biopsies showed intraepithelial eosinophils. Not quantified. Reflux has been controlled on daily omeprazole since that time. No symptoms with adherence. Heartburn and intermittent dysphagia if he misses several doses.   Brother (at age 81) and father with colon polyps. Maternal grandfather with colon cancer.  No known family history of colon cancer or polyps. No family history of uterine/endometrial cancer, pancreatic cancer or gastric/stomach cancer.   Past Medical History:  Diagnosis Date   Allergic rhinitis    Anxiety    Depression    GERD (gastroesophageal reflux disease)    HTN (hypertension)     Past Surgical History:  Procedure Laterality Date   FINGER SURGERY Right    pinky   INGUINAL HERNIA REPAIR Left    TONSILLECTOMY AND ADENOIDECTOMY     VASECTOMY      Current Outpatient  Medications  Medication Sig Dispense Refill   nicotine polacrilex (COMMIT) 4 MG lozenge Take 4 mg by mouth as needed for smoking cessation.     omeprazole (PRILOSEC) 20 MG capsule TAKE ONE CAPSULE BY MOUTH EVERY DAY 90 capsule 2   diphenhydrAMINE (BENADRYL) 25 mg capsule Take 25 mg by mouth every 6 (six) hours as needed.     Lemborexant (DAYVIGO) 5 MG TABS Take 5 mg by mouth at bedtime. Can increase to 10 mg po QHS after 3-5 days if 5 mg is ineffective 10 tablet 0   MELATONIN PO Take by mouth.     sertraline (ZOLOFT) 100 MG tablet Take 1.5 tablets (150 mg total) by mouth daily. 135 tablet 3   traZODone (DESYREL) 50 MG tablet Take 1-1.5 tabs po QHS 135 tablet 3   No current facility-administered medications for this visit.    Allergies as of 04/09/2021 - Review Complete 04/09/2021  Allergen Reaction Noted   Penicillins Rash     Family History  Problem Relation Age of Onset   Clotting disorder Mother    Pulmonary embolism Mother    Factor V Leiden deficiency Mother    Colon polyps Father    Colon polyps Brother    Colon cancer Maternal Grandfather    Breast cancer Paternal Grandmother     Social History   Socioeconomic History   Marital status: Married    Spouse name: Not on file   Number of children: 2   Years of education: Not on file   Highest education level: Not on file  Occupational History   Occupation: Press photographer  Tobacco  Use   Smoking status: Former    Types: Cigarettes    Quit date: 1997    Years since quitting: 25.6   Smokeless tobacco: Former    Types: Chew    Quit date: 2018   Tobacco comments:    Cig in college years, use Nicorette lozenges  Vaping Use   Vaping Use: Never used  Substance and Sexual Activity   Alcohol use: Yes    Comment: 1-2 per day   Drug use: No   Sexual activity: Not on file  Other Topics Concern   Not on file  Social History Narrative   Not on file   Social Determinants of Health   Financial Resource Strain: Not on file  Food  Insecurity: Not on file  Transportation Needs: Not on file  Physical Activity: Not on file  Stress: Not on file  Social Connections: Not on file  Intimate Partner Violence: Not on file    Review of Systems: 12 system ROS is negative except as noted above with the addition of vision changes.   Physical Exam: General:   Alert,  well-nourished, pleasant and cooperative in NAD Head:  Normocephalic and atraumatic. Eyes:  Sclera clear, no icterus.   Conjunctiva pink. Ears:  Normal auditory acuity. Nose:  No deformity, discharge,  or lesions. Mouth:  No deformity or lesions.   Neck:  Supple; no masses or thyromegaly. Lungs:  Clear throughout to auscultation.   No wheezes. Heart:  Regular rate and rhythm; no murmurs. Abdomen:  Soft,nontender, nondistended, normal bowel sounds, no rebound or guarding. No hepatosplenomegaly.   Rectal:  Deferred  Msk:  Symmetrical. No boney deformities LAD: No inguinal or umbilical LAD Extremities:  No clubbing or edema. Neurologic:  Alert and  oriented x4;  grossly nonfocal Skin:  Intact without significant lesions or rashes. Psych:  Alert and cooperative. Normal mood and affect.    Nikie Cid L. Tarri Glenn, MD, MPH 04/12/2021, 11:32 AM

## 2021-04-09 NOTE — Patient Instructions (Addendum)
I have recommended a upper endoscopy and colonoscopy for further evaluation given your family history of colon polyps.  We will plan an upper endoscopy at the same time to follow-up on your esophagitis.   Tips for colonoscopy:  - Stay well hydrated for 3-4 days prior to the exam. This reduces nausea and dehydration.  - To prevent skin/hemorrhoid irritation - prior to wiping, put A&Dointment or vaseline on the toilet paper. - Keep a towel or pad on the bed.  - Drink  64oz of clear liquids in the morning of prep day (prior to starting the prep) to be sure that there is enough fluid to flush the colon and stay hydrated!!!! This is in addition to the fluids required for preparation. - Use of a flavored hard candy, such as grape Anise Salvo, can counteract some of the flavor of the prep and may prevent some nausea.

## 2021-04-10 ENCOUNTER — Encounter: Payer: Self-pay | Admitting: Gastroenterology

## 2021-04-10 ENCOUNTER — Other Ambulatory Visit: Payer: Self-pay

## 2021-04-10 ENCOUNTER — Ambulatory Visit (AMBULATORY_SURGERY_CENTER): Payer: BC Managed Care – PPO | Admitting: Gastroenterology

## 2021-04-10 VITALS — BP 128/85 | HR 52 | Temp 98.0°F | Resp 18 | Ht 68.5 in | Wt 248.0 lb

## 2021-04-10 DIAGNOSIS — K31A0 Gastric intestinal metaplasia, unspecified: Secondary | ICD-10-CM

## 2021-04-10 DIAGNOSIS — Z86010 Personal history of colonic polyps: Secondary | ICD-10-CM | POA: Diagnosis not present

## 2021-04-10 DIAGNOSIS — D128 Benign neoplasm of rectum: Secondary | ICD-10-CM | POA: Diagnosis not present

## 2021-04-10 DIAGNOSIS — K219 Gastro-esophageal reflux disease without esophagitis: Secondary | ICD-10-CM | POA: Diagnosis not present

## 2021-04-10 DIAGNOSIS — Z8371 Family history of colonic polyps: Secondary | ICD-10-CM | POA: Diagnosis not present

## 2021-04-10 DIAGNOSIS — Z1211 Encounter for screening for malignant neoplasm of colon: Secondary | ICD-10-CM | POA: Diagnosis not present

## 2021-04-10 DIAGNOSIS — K2970 Gastritis, unspecified, without bleeding: Secondary | ICD-10-CM | POA: Diagnosis not present

## 2021-04-10 DIAGNOSIS — K200 Eosinophilic esophagitis: Secondary | ICD-10-CM

## 2021-04-10 DIAGNOSIS — K2289 Other specified disease of esophagus: Secondary | ICD-10-CM | POA: Diagnosis not present

## 2021-04-10 DIAGNOSIS — D123 Benign neoplasm of transverse colon: Secondary | ICD-10-CM | POA: Diagnosis not present

## 2021-04-10 DIAGNOSIS — K31A11 Gastric intestinal metaplasia without dysplasia, involving the antrum: Secondary | ICD-10-CM | POA: Diagnosis not present

## 2021-04-10 MED ORDER — SODIUM CHLORIDE 0.9 % IV SOLN: 500.0000 mL | Freq: Once | INTRAVENOUS | Status: DC

## 2021-04-10 NOTE — Progress Notes (Signed)
No problems noted in the recovery room. maw 

## 2021-04-10 NOTE — Op Note (Signed)
[facility] Patient Name: Ernest Bowen Procedure Date: 04/10/2021 11:32 AM MRN: [id number] Endoscopist: Thornton Park MD, MD Age: 45 Referring MD:  Date of Birth: 26-Oct-1975 Gender: Male Account #: [id number] Procedure:                Upper GI endoscopy Indications:              Dysphagia if a dose of PPI is missed, Heartburn,                            history of eosinophils on esophageal biopsies in                            2010 Medicines:                Monitored Anesthesia Care Procedure:                Pre-Anesthesia Assessment:                           - Prior to the procedure, a History and Physical                            was performed, and patient medications and                            allergies were reviewed. The patient's tolerance of                            previous anesthesia was also reviewed. The risks                            and benefits of the procedure and the sedation                            options and risks were discussed with the patient.                            All questions were answered, and informed consent                            was obtained. Prior Anticoagulants: The patient has                            taken no previous anticoagulant or antiplatelet                            agents. ASA Grade Assessment: III - A patient with                            severe systemic disease. After reviewing the risks                            and benefits, the patient was deemed in  satisfactory condition to undergo the procedure.                           After obtaining informed consent, the endoscope was                            passed under direct vision. Throughout the                            procedure, the patient's blood pressure, pulse, and                            oxygen saturations were monitored continuously. The                            Endoscope was introduced through the mouth, and                             advanced to the third part of duodenum. The upper                            GI endoscopy was accomplished without difficulty.                            The patient tolerated the procedure well. Scope In: Scope Out: Findings:                 The examined esophagus was normal. The z-line is                            located 40 cm from the incisors. Biopsies were                            obtained from the proximal and distal esophagus                            with cold forceps for histology of suspected                            eosinophilic esophagitis.                           Localized minimal inflammation characterized by                            erythema was found in the gastric antrum. Biopsies                            were taken from the antrum, body, and fundus with a                            cold forceps for histology. Estimated blood loss  was minimal.                           The examined duodenum was normal. Complications:            No immediate complications. Estimated blood loss:                            Minimal. Estimated Blood Loss:     Estimated blood loss was minimal. Impression:               - Normal esophagus. Biopsied.                           - Gastritis. Biopsied.                           - Normal examined duodenum. Recommendation:           - Patient has a contact number available for                            emergencies. The signs and symptoms of potential                            delayed complications were discussed with the                            patient. Return to normal activities tomorrow.                            Written discharge instructions were provided to the                            patient.                           - Resume previous diet.                           - Continue present medications.                           - Await pathology results. Thornton Park  MD, MD 04/10/2021 12:10:43 PM This report has been signed electronically.

## 2021-04-10 NOTE — Op Note (Signed)
[facility] Patient Name: Ernest Bowen Procedure Date: 04/10/2021 11:31 AM MRN: [id number] Endoscopist: Thornton Park MD, MD Age: 45 Referring MD:  Date of Birth: August 26, 1976 Gender: Male Account #: [id number] Procedure:                Colonoscopy Indications:              Colon cancer screening in patient at increased                            risk: Family history of colon polyps in multiple                            1st-degree relatives                           Brother (at age 33) and father with colon polyps.                            Maternal grandfather with colon cancer. Medicines:                Monitored Anesthesia Care Procedure:                Pre-Anesthesia Assessment:                           - Prior to the procedure, a History and Physical                            was performed, and patient medications and                            allergies were reviewed. The patient's tolerance of                            previous anesthesia was also reviewed. The risks                            and benefits of the procedure and the sedation                            options and risks were discussed with the patient.                            All questions were answered, and informed consent                            was obtained. Prior Anticoagulants: The patient has                            taken no previous anticoagulant or antiplatelet                            agents. ASA Grade Assessment: III - A patient with  severe systemic disease. After reviewing the risks                            and benefits, the patient was deemed in                            satisfactory condition to undergo the procedure.                           After obtaining informed consent, the colonoscope                            was passed under direct vision. Throughout the                            procedure, the patient's blood pressure, pulse, and                             oxygen saturations were monitored continuously. The                            CF [id number] was introduced through the anus                            and advanced to the 3 cm into the ileum. A second                            forward view of the right colon was performed. The                            colonoscopy was performed without difficulty. The                            patient tolerated the procedure well. The quality                            of the bowel preparation was good. The terminal                            ileum, ileocecal valve, appendiceal orifice, and                            rectum were photographed. Scope In: 11:50:25 AM Scope Out: [id number] PM Scope Withdrawal Time: 0 hours 13 minutes 8 seconds  Total Procedure Duration: 0 hours 15 minutes 54 seconds  Findings:                 The perianal and digital rectal examinations were                            normal.                           Four sessile polyps were found in the rectum. The  polyps were 1 to 4 mm in size. These polyps were                            removed with a cold snare. Resection and retrieval                            were complete. Estimated blood loss was minimal.                           A 2 mm polyp was found in the transverse colon. The                            polyp was sessile. The polyp was removed with a                            cold snare. Resection and retrieval were complete.                            Estimated blood loss was minimal.                           The exam was otherwise without abnormality on                            direct and retroflexion views. Complications:            No immediate complications. Estimated blood loss:                            Minimal. Estimated Blood Loss:     Estimated blood loss was minimal. Impression:               - Four 1 to 4 mm polyps in the rectum, removed with                             a cold snare. Resected and retrieved.                           - One 2 mm polyp in the transverse colon, removed                            with a cold snare. Resected and retrieved.                           - The examination was otherwise normal on direct                            and retroflexion views. Recommendation:           - Patient has a contact number available for                            emergencies. The signs and symptoms of potential  delayed complications were discussed with the                            patient. Return to normal activities tomorrow.                            Written discharge instructions were provided to the                            patient.                           - Resume previous diet.                           - Continue present medications.                           - Await pathology results.                           - Repeat colonoscopy date to be determined after                            pending pathology results are reviewed for                            surveillance.                           - Emerging evidence supports eating a diet of                            fruits, vegetables, grains, calcium, and yogurt                            while reducing red meat and alcohol may reduce the                            risk of colon cancer.                           - Thank you for allowing me to be involved in your                            colon cancer prevention. Thornton Park MD, MD 04/10/2021 12:14:16 PM This report has been signed electronically.

## 2021-04-10 NOTE — Progress Notes (Signed)
Called to room to assist during endoscopic procedure.  Patient ID and intended procedure confirmed with present staff. Received instructions for my participation in the procedure from the performing physician.  

## 2021-04-10 NOTE — Progress Notes (Signed)
pt tolerated well. VSS. awake and to recovery. Report given to RN.  

## 2021-04-10 NOTE — Patient Instructions (Addendum)
Handouts were given to your care partner on Gastritis and Colon Polyps. You may resume your current medications today. Await biopsy results.  May take 1-3 weeks to receive pathology results. Please call if any questions or concerns.     YOU HAD AN ENDOSCOPIC PROCEDURE TODAY AT [facility]:   Refer to the procedure report that was given to you for any specific questions about what was found during the examination.  If the procedure report does not answer your questions, please call your gastroenterologist to clarify.  If you requested that your care partner not be given the details of your procedure findings, then the procedure report has been included in a sealed envelope for you to review at your convenience later.  YOU SHOULD EXPECT: Some feelings of bloating in the abdomen. Passage of more gas than usual.  Walking can help get rid of the air that was put into your GI tract during the procedure and reduce the bloating. If you had a lower endoscopy (such as a colonoscopy or flexible sigmoidoscopy) you may notice spotting of blood in your stool or on the toilet paper. If you underwent a bowel prep for your procedure, you may not have a normal bowel movement for a few days.  Please Note:  You might notice some irritation and congestion in your nose or some drainage.  This is from the oxygen used during your procedure.  There is no need for concern and it should clear up in a day or so.  SYMPTOMS TO REPORT IMMEDIATELY:  Following lower endoscopy (colonoscopy or flexible sigmoidoscopy):  Excessive amounts of blood in the stool  Significant tenderness or worsening of abdominal pains  Swelling of the abdomen that is new, acute  Fever of 100F or higher  Following upper endoscopy (EGD)  Vomiting of blood or coffee ground material  New chest pain or pain under the shoulder blades  Painful or persistently difficult swallowing  New shortness of breath  Fever of 100F or  higher  Black, tarry-looking stools  For urgent or emergent issues, a gastroenterologist can be reached at any hour by calling [phone]. Do not use MyChart messaging for urgent concerns.    DIET:  We do recommend a small meal at first, but then you may proceed to your regular diet.  Drink plenty of fluids but you should avoid alcoholic beverages for 24 hours.  ACTIVITY:  You should plan to take it easy for the rest of today and you should NOT DRIVE or use heavy machinery until tomorrow (because of the sedation medicines used during the test).    FOLLOW UP: Our staff will call the number listed on your records 48-72 hours following your procedure to check on you and address any questions or concerns that you may have regarding the information given to you following your procedure. If we do not reach you, we will leave a message.  We will attempt to reach you two times.  During this call, we will ask if you have developed any symptoms of COVID 19. If you develop any symptoms (ie: fever, flu-like symptoms, shortness of breath, cough etc.) before then, please call [phone].  If you test positive for Covid 19 in the 2 weeks post procedure, please call and report this information to [place].    If any biopsies were taken you will be contacted by phone or by letter within the next 1-3 weeks.  Please call us at [phone] if you have not heard  about the biopsies in 3 weeks.    SIGNATURES/CONFIDENTIALITY: You and/or your care partner have signed paperwork which will be entered into your electronic medical record.  These signatures attest to the fact that that the information above on your After Visit Summary has been reviewed and is understood.  Full responsibility of the confidentiality of this discharge information lies with you and/or your care-partner.

## 2021-04-10 NOTE — Progress Notes (Signed)
Medical history reviewed with no changes noted. VS assessed by A.S 

## 2021-04-12 ENCOUNTER — Encounter: Payer: Self-pay | Admitting: Gastroenterology

## 2021-04-12 ENCOUNTER — Telehealth: Payer: Self-pay

## 2021-04-12 ENCOUNTER — Ambulatory Visit: Payer: BC Managed Care – PPO | Admitting: Psychiatry

## 2021-04-12 ENCOUNTER — Other Ambulatory Visit: Payer: Self-pay

## 2021-04-12 ENCOUNTER — Encounter: Payer: Self-pay | Admitting: Psychiatry

## 2021-04-12 DIAGNOSIS — G4700 Insomnia, unspecified: Secondary | ICD-10-CM

## 2021-04-12 DIAGNOSIS — F411 Generalized anxiety disorder: Secondary | ICD-10-CM

## 2021-04-12 MED ORDER — TRAZODONE HCL 50 MG PO TABS: ORAL_TABLET | ORAL | 3 refills | Status: DC

## 2021-04-12 MED ORDER — SERTRALINE HCL 100 MG PO TABS: 150.0000 mg | ORAL_TABLET | Freq: Every day | ORAL | 3 refills | Status: DC

## 2021-04-12 MED ORDER — DAYVIGO 5 MG PO TABS: 5.0000 mg | ORAL_TABLET | Freq: Every day | ORAL | 0 refills | Status: DC

## 2021-04-12 NOTE — Telephone Encounter (Signed)
  Follow up Call-  Call back number 04/10/2021  Post procedure Call Back phone  # [phone]  Permission to leave phone message Yes  Some recent data might be hidden     Patient questions:  Do you have a fever, pain , or abdominal swelling? No. Pain Score  0 *  Have you tolerated food without any problems? Yes.    Have you been able to return to your normal activities? Yes.    Do you have any questions about your discharge instructions: Diet   No. Medications  No. Follow up visit  No.  Do you have questions or concerns about your Care? No.  Actions: * If pain score is 4 or above: No action needed, pain <4.

## 2021-04-12 NOTE — Progress Notes (Signed)
Ernest Bowen [id number] 09/27/1975 45 y.o.  Subjective:   Patient ID:  Ernest Bowen is a 45 y.o. (DOB 09-04-1975) male.  Chief Complaint:  Chief Complaint  Patient presents with   Bowen   Follow-up    H/o anxiety Ernest ADHD    HPI Ernest Bowen presents to the office today for follow-up of anxiety, ADHD, Ernest Bowen. Ernest Bowen in January Ernest Ernest Bowen reports that Ernest Bowen has not seen a significant change. Ernest Bowen reports that Ernest Bowen always has a lot of ideas but is able to focus Ernest accomplish what needs to happen due to increased motivation. Energy is low at the end of the day.   Ernest Bowen reports that Ernest Bowen tried to decrease Sertraline to one tab since last visit Ernest had some increased anxiety Ernest resumed 150 mg dose. Denies depressed Ernest. Ernest Bowen since age [age]. Ernest Bowen reports that Ernest Bowen is unable to sleep unless there are "perfect conditions." Unable to sleep in certain conditions, such as camping. Ernest Bowen reports that Ernest Bowen takes Benadryl, Melatonin, Ernest Trazodone to sleep. Difficulty falling asleep. Difficulty returning to sleep after middle of the night awakening. Denies restless leg movements. Slept about 4 hours last night. Ernest Bowen reports that sleep study was negative for sleep apnea. Ernest Bowen reports some weight gain. Denies SI.  Ernest Bowen are 45 yo Ernest 45 yo. Ernest Bowen reports children are busy with activities.   Ernest Bowen. Some travel but significantly less. Working 60-70 hours a day. Ernest Bowen. Ernest Bowen.   Past Psychiatric Medication Trials: Sertraline-effective.  Some sexual side effects with increased to 150 mg daily Celexa-initially effective Ernest was then no longer as effective for signs Ernest symptoms. BuSpar-ineffective Trazodone-effective for Bowen Bowen-effective Klonopin Chantix-increased anxiety Ernest irritability    Review of  Systems:  Review of Systems  Gastrointestinal: Negative.        Recently has polyps removed  Musculoskeletal:  Negative for gait problem.  Neurological:  Negative for tremors.  Psychiatric/Behavioral:         Please refer to HPI   Medications: I have reviewed the patient's current medications.  Current Outpatient Medications  Medication Sig Dispense Refill   diphenhydrAMINE (BENADRYL) 25 mg capsule Take 25 mg by mouth every 6 (six) hours as needed.     Lemborexant (DAYVIGO) 5 MG TABS Take 5 mg by mouth at bedtime. Can increase to 10 mg po QHS after 3-5 days if 5 mg is ineffective 10 tablet 0   MELATONIN PO Take by mouth.     nicotine polacrilex (COMMIT) 4 MG lozenge Take 4 mg by mouth as needed for smoking cessation.     omeprazole (PRILOSEC) 20 MG capsule TAKE ONE CAPSULE BY MOUTH EVERY DAY 90 capsule 2   sertraline (ZOLOFT) 100 MG tablet Take 1.5 tablets (150 mg total) by mouth daily. 135 tablet 3   traZODone (DESYREL) 50 MG tablet Take 1-1.5 tabs po QHS 135 tablet 3   No current facility-administered medications for this visit.    Medication Side Effects: None  Allergies:  Allergies  Allergen Reactions   Penicillins Rash    Has patient had a PCN reaction causing immediate rash, facial/tongue/throat swelling, SOB or lightheadedness with hypotension: No Has patient had a PCN reaction causing severe rash involving mucus membranes or skin necrosis: No Has patient had a PCN reaction that required hospitalization No Has patient had  a PCN reaction occurring within the last 10 years: No If all of the above answers are "NO", then may proceed with Cephalosporin use.     Past Medical History:  Diagnosis Date   Allergic rhinitis    Anxiety    Depression    GERD (gastroesophageal reflux disease)    HTN (hypertension)     Past Medical History, Surgical history, Social history, Ernest Family history were reviewed Ernest updated as appropriate.   Please see review of systems for further  details on the patient's review from today.   Objective:   Physical Exam:  There were no vitals taken for this visit.  Physical Exam Constitutional:      General: Ernest Bowen is not in acute distress. Musculoskeletal:        General: No deformity.  Neurological:     Mental Status: Ernest Bowen, Ernest Bowen, Ernest Bowen.     Coordination: Coordination Bowen.  Psychiatric:        Ernest Bowen Ernest Perception: Ernest Bowen Ernest perception Bowen. Ernest Bowen.        Ernest Bowen. Ernest is not anxious or depressed. Affect is not labile, blunt, angry or inappropriate.        Ernest Bowen.        Ernest Bowen.        Thought Content: Thought content Bowen. Thought content is not paranoid or delusional. Thought content does not include homicidal or suicidal ideation. Thought content does not include homicidal or suicidal plan.        Cognition Ernest Memory: Cognition Ernest memory Bowen.        Judgment: Judgment Bowen.     Comments: Insight intact    Lab Review:     Component Value Date/Bowen   NA 136 11/22/2010 0822   K 4.7 11/22/2010 0822   CL 101 11/22/2010 0822   CO2 29 11/22/2010 0822   GLUCOSE 84 11/22/2010 0822   GLUCOSE 94 09/03/2006 1138   BUN 19 11/22/2010 0822   CREATININE 1.0 11/22/2010 0822   CALCIUM 9.0 11/22/2010 0822   PROT 6.5 11/22/2010 0822   ALBUMIN 4.2 11/22/2010 0822   AST 29 11/22/2010 0822   ALT 19 11/22/2010 0822   ALKPHOS 60 11/22/2010 0822   BILITOT 0.6 11/22/2010 0822   GFRNONAA 91.39 10/31/2009 0850   GFRAA 144 10/11/2008 1027       Component Value Date/Bowen   WBC 6.1 11/22/2010 0822   RBC 4.70 11/22/2010 0822   HGB 14.5 11/22/2010 0822   HCT 42.9 11/22/2010 0822   PLT 228.0 11/22/2010 0822   MCV 91.4 11/22/2010 0822   MCHC 33.8 11/22/2010 0822   RDW 12.4 11/22/2010 0822   LYMPHSABS 2.1 11/22/2010 0822   MONOABS 0.5 11/22/2010 0822   EOSABS 0.3 11/22/2010 0822    BASOSABS 0.0 11/22/2010 0822    No results found for: POCLITH, LITHIUM   No results found for: PHENYTOIN, PHENOBARB, VALPROATE, CBMZ   .res Assessment: Plan:    Patient seen for 30 minutes Ernest Bowen spent counseling the patient regarding treatment options for Bowen. Discussed potential benefits, risks, Ernest Ernest side effects of Dayvigo for Bowen.  Patient agrees to trial of Dayvigo.  Patient provided with Dayvigo 5 mg samples Ernest advised to take 1 tablet by mouth at bedtime, Ernest to increase to 2 tabs after 3 to 5 days if 5 mg dose is ineffective.  Patient advised to contact office  if Jarold Motto is effective Ernest to request a script for the dose that is effective. Discussed that ADHD signs Ernest symptoms have been manageable since discontinuation of Bowen Ernest therefore additional treatment for ADHD is not indicated at this Bowen. Will continue sertraline 150 mg daily for anxiety since Ernest Bowen reports experiencing worsening anxiety signs Ernest symptoms with dose reduction. Will continue trazodone as needed for Bowen in the event that Dayvigo is effective. Patient to follow-up in 1 year or sooner if clinically indicated. Patient advised to contact office with any questions, adverse effects, or acute worsening in signs Ernest symptoms.   Remond was seen today for Bowen Ernest follow-up.  Diagnoses Ernest all orders for this visit:  Bowen, unspecified type -     Lemborexant (DAYVIGO) 5 MG TABS; Take 5 mg by mouth at bedtime. Can increase to 10 mg po QHS after 3-5 days if 5 mg is ineffective -     traZODone (DESYREL) 50 MG tablet; Take 1-1.5 tabs po QHS  Generalized anxiety disorder -     sertraline (ZOLOFT) 100 MG tablet; Take 1.5 tablets (150 mg total) by mouth daily.    Please see After Visit Summary for patient specific instructions.  Future Appointments  Date Bowen Provider [place]  04/12/2022  8:30 AM Thayer Headings, PMHNP CP-CP None    No orders of the defined types were  placed in this encounter.   -------------------------------

## 2021-04-16 ENCOUNTER — Encounter: Payer: Self-pay | Admitting: Gastroenterology

## 2021-07-14 ENCOUNTER — Emergency Department (HOSPITAL_COMMUNITY): Payer: BC Managed Care – PPO

## 2021-07-14 ENCOUNTER — Emergency Department (HOSPITAL_COMMUNITY)
Admission: EM | Admit: 2021-07-14 | Discharge: 2021-07-14 | Disposition: A | Discharge status: Home / Self Care | Payer: BC Managed Care – PPO | Attending: Emergency Medicine | Admitting: Emergency Medicine

## 2021-07-14 ENCOUNTER — Encounter (HOSPITAL_COMMUNITY): Payer: Self-pay

## 2021-07-14 DIAGNOSIS — Z87891 Personal history of nicotine dependence: Secondary | ICD-10-CM | POA: Insufficient documentation

## 2021-07-14 DIAGNOSIS — D72829 Elevated white blood cell count, unspecified: Secondary | ICD-10-CM | POA: Insufficient documentation

## 2021-07-14 DIAGNOSIS — R0602 Shortness of breath: Secondary | ICD-10-CM | POA: Insufficient documentation

## 2021-07-14 DIAGNOSIS — I10 Essential (primary) hypertension: Secondary | ICD-10-CM | POA: Insufficient documentation

## 2021-07-14 DIAGNOSIS — R079 Chest pain, unspecified: Secondary | ICD-10-CM | POA: Diagnosis not present

## 2021-07-14 DIAGNOSIS — R0789 Other chest pain: Secondary | ICD-10-CM | POA: Diagnosis not present

## 2021-07-14 DIAGNOSIS — Z20822 Contact with and (suspected) exposure to covid-19: Secondary | ICD-10-CM | POA: Insufficient documentation

## 2021-07-14 LAB — COMPREHENSIVE METABOLIC PANEL
ALT: 19 U/L (ref 0–44)
AST: 26 U/L (ref 15–41)
Albumin: 4.5 g/dL (ref 3.5–5.0)
Alkaline Phosphatase: 54 U/L (ref 38–126)
Anion gap: 8 (ref 5–15)
BUN: 21 mg/dL — ABNORMAL HIGH (ref 6–20)
CO2: 25 mmol/L (ref 22–32)
Calcium: 9.4 mg/dL (ref 8.9–10.3)
Chloride: 102 mmol/L (ref 98–111)
Creatinine, Ser: 0.84 mg/dL (ref 0.61–1.24)
GFR, Estimated: >60 mL/min (ref >60)
Glucose, Bld: 141 mg/dL — ABNORMAL HIGH (ref 70–99)
Potassium: 4.1 mmol/L (ref 3.5–5.1)
Sodium: 135 mmol/L (ref 135–145)
Total Bilirubin: 0.7 mg/dL (ref 0.3–1.2)
Total Protein: 6.9 g/dL (ref 6.5–8.1)

## 2021-07-14 LAB — CBC WITH DIFFERENTIAL/PLATELET
Abs Immature Granulocytes: 0.04 10*3/uL (ref 0.00–0.07)
Basophils Absolute: 0 10*3/uL (ref 0.0–0.1)
Basophils Relative: 0 %
Eosinophils Absolute: 0.1 10*3/uL (ref 0.0–0.5)
Eosinophils Relative: 1 %
HCT: 44.8 % (ref 39.0–52.0)
Hemoglobin: 15.2 g/dL (ref 13.0–17.0)
Immature Granulocytes: 0 %
Lymphocytes Relative: 12 %
Lymphs Abs: 1.4 10*3/uL (ref 0.7–4.0)
MCH: 30.8 pg (ref 26.0–34.0)
MCHC: 33.9 g/dL (ref 30.0–36.0)
MCV: 90.9 fL (ref 80.0–100.0)
Monocytes Absolute: 0.7 10*3/uL (ref 0.1–1.0)
Monocytes Relative: 5 %
Neutro Abs: 10.1 10*3/uL — ABNORMAL HIGH (ref 1.7–7.7)
Neutrophils Relative %: 82 %
Platelets: 261 10*3/uL (ref 150–400)
RBC: 4.93 MIL/uL (ref 4.22–5.81)
RDW: 12.2 % (ref 11.5–15.5)
WBC: 12.4 10*3/uL — ABNORMAL HIGH (ref 4.0–10.5)
nRBC: 0 % (ref 0.0–0.2)

## 2021-07-14 LAB — D-DIMER, QUANTITATIVE: D-Dimer, Quant: 0.38 ug/mL-FEU (ref 0.00–0.50)

## 2021-07-14 LAB — TROPONIN I (HIGH SENSITIVITY)
Troponin I (High Sensitivity): 3 ng/L (ref <18)
Troponin I (High Sensitivity): 3 ng/L (ref <18)

## 2021-07-14 LAB — RESP PANEL BY RT-PCR (FLU A&B, COVID) ARPGX2
Influenza A by PCR: NEGATIVE
Influenza B by PCR: NEGATIVE
SARS Coronavirus 2 by RT PCR: NEGATIVE

## 2021-07-14 MED ORDER — ACETAMINOPHEN 500 MG PO TABS
1000.0000 mg | ORAL_TABLET | Freq: Once | ORAL | Status: AC
  Administered 2021-07-14: 1000 mg via ORAL
  Filled 2021-07-14: qty 2

## 2021-07-14 MED ORDER — LIDOCAINE VISCOUS HCL 2 % MT SOLN
15.0000 mL | Freq: Once | OROMUCOSAL | Status: AC
  Administered 2021-07-14: 15 mL via ORAL
  Filled 2021-07-14: qty 15

## 2021-07-14 MED ORDER — ALUM & MAG HYDROXIDE-SIMETH 200-200-20 MG/5ML PO SUSP
30.0000 mL | Freq: Once | ORAL | Status: AC
  Administered 2021-07-14: 30 mL via ORAL
  Filled 2021-07-14: qty 30

## 2021-07-14 NOTE — ED Triage Notes (Signed)
Pt c/o chest pain, sob,and nausea that woke him from his sleep last night.

## 2021-07-14 NOTE — Discharge Instructions (Signed)
Use tylenol and ibuprofen as needed for pain.  Return if you develop shortness of breath, worsening pain, fever or vomiting.

## 2021-07-14 NOTE — ED Provider Notes (Signed)
[facility] DEPT Provider Note   CSN: [id number] Arrival date & time: 07/14/21  4174     History Chief Complaint  Patient presents with   Chest Pain    Ernest Bowen is a 45 y.o. male.  Patient is a 45 year old male with a history of GERD, anxiety, hypertension who is presenting today with complaint of chest pain.  Patient reports that the pain started a little bit last night but woke him up around 530 this morning and has been persistent.  He describes as a very uncomfortable sharp pain in his upper chest which is worse with certain movements and taking deep breaths.  It is also made him feel short of breath.  It does not radiate into his back, arms or abdomen.  He does feel like he has had some acid in his throat and some reflux overnight but denies any change in his diet.  He did recently fly back from [place] on Thursday but it was a 2-1/2-hour flight x2 with a stop in between.  He has not had any congestion, cough or flulike symptoms.  He does not have any history of lung disease and does not use inhalers.  He no longer uses tobacco products.  He denies any unilateral leg pain or swelling.  No family history of cardiac disease other than his father who has hypertension.  Also his mother was diagnosed with factor V Leiden but he is never been tested.  He has not tried taking any medication to make the pain better.  The history is provided by the patient.  Chest Pain     Past Medical History:  Diagnosis Date   Allergic rhinitis    Anxiety    Depression    GERD (gastroesophageal reflux disease)    HTN (hypertension)     Patient Active Problem List   Diagnosis Date Noted   Major depressive disorder, recurrent episode, in full remission ([place]) 10/20/2018   Generalized anxiety disorder 10/20/2018   GERD (gastroesophageal reflux disease) 05/07/2012   ADHD (attention deficit hyperactivity disorder) 12/10/2010   DYSPHAGIA UNSPECIFIED 02/20/2009    TOBACCO USE 09/22/2007   DEPRESSION 09/22/2007    Past Surgical History:  Procedure Laterality Date   FINGER SURGERY Right    pinky   INGUINAL HERNIA REPAIR Left    TONSILLECTOMY AND ADENOIDECTOMY     VASECTOMY         Family History  Problem Relation Age of Onset   Clotting disorder Mother    Pulmonary embolism Mother    Factor V Leiden deficiency Mother    Colon polyps Father    Colon polyps Brother    Colon cancer Maternal Grandfather    Breast cancer Paternal Grandmother     Social History   Tobacco Use   Smoking status: Former    Types: Cigarettes    Quit date: 1997    Years since quitting: 25.8   Smokeless tobacco: Former    Types: Chew    Quit date: 2018   Tobacco comments:    Cig in college years, use Nicorette lozenges  Vaping Use   Vaping Use: Never used  Substance Use Topics   Alcohol use: Yes    Comment: 1-2 per day   Drug use: No    Home Medications Prior to Admission medications   Medication Sig Start Date End Date Taking? Authorizing Provider  diphenhydrAMINE (BENADRYL) 25 mg capsule Take 25 mg by mouth every 6 (six) hours as needed.  [provider]  Lemborexant (DAYVIGO) 5 MG TABS Take 5 mg by mouth at bedtime. Can increase to 10 mg po QHS after 3-5 days if 5 mg is ineffective 04/12/21   Thayer Headings, PMHNP  MELATONIN PO Take by mouth.    [provider]  nicotine polacrilex (COMMIT) 4 MG lozenge Take 4 mg by mouth as needed for smoking cessation.    [provider]  omeprazole (PRILOSEC) 20 MG capsule TAKE ONE CAPSULE BY MOUTH EVERY DAY 06/02/12   Swords, Darrick Penna, MD  sertraline (ZOLOFT) 100 MG tablet Take 1.5 tablets (150 mg total) by mouth daily. 04/12/21   Thayer Headings, PMHNP  traZODone (DESYREL) 50 MG tablet Take 1-1.5 tabs po QHS 04/12/21   Thayer Headings, PMHNP    Allergies    Penicillins  Review of Systems   Review of Systems  Cardiovascular:  Positive for chest pain.  All other systems  reviewed and are negative.  Physical Exam Updated Vital Signs BP 138/90 (BP Location: Right Arm)   Pulse 62   Temp 97.8 F (36.6 C) (Oral)   Resp 18   SpO2 97%   Physical Exam Vitals and nursing note reviewed.  Constitutional:      General: He is not in acute distress.    Appearance: He is well-developed.  HENT:     Head: Normocephalic and atraumatic.     Mouth/Throat:     Mouth: Mucous membranes are moist.  Eyes:     Conjunctiva/sclera: Conjunctivae normal.     Pupils: Pupils are equal, round, and reactive to light.  Cardiovascular:     Rate and Rhythm: Normal rate and regular rhythm.     Heart sounds: No murmur heard. Pulmonary:     Effort: Pulmonary effort is normal. No respiratory distress.     Breath sounds: Normal breath sounds. No wheezing or rales.  Chest:     Chest wall: No tenderness.  Abdominal:     General: There is no distension.     Palpations: Abdomen is soft.     Tenderness: There is no abdominal tenderness. There is no guarding or rebound.  Musculoskeletal:        General: No tenderness. Normal range of motion.     Cervical back: Normal range of motion and neck supple.     Right lower leg: No edema.     Left lower leg: No edema.  Skin:    General: Skin is warm and dry.     Findings: No erythema or rash.  Neurological:     Mental Status: He is alert and oriented to person, place, and time. Mental status is at baseline.  Psychiatric:        Mood and Affect: Mood normal.        Behavior: Behavior normal.    ED Results / Procedures / Treatments   Labs (all labs ordered are listed, but only abnormal results are displayed) Labs Reviewed  CBC WITH DIFFERENTIAL/PLATELET - Abnormal; Notable for the following components:      Result Value   WBC 12.4 (*)    Neutro Abs 10.1 (*)    All other components within normal limits  COMPREHENSIVE METABOLIC PANEL - Abnormal; Notable for the following components:   Glucose, Bld 141 (*)    BUN 21 (*)    All other  components within normal limits  RESP PANEL BY RT-PCR (FLU A&B, COVID) [id number]  D-DIMER, QUANTITATIVE  TROPONIN I (HIGH SENSITIVITY)  TROPONIN I (HIGH SENSITIVITY)  EKG EKG Interpretation  Date/Time:  Saturday July 14 2021 07:37:09 EST Ventricular Rate:  67 PR Interval:  160 QRS Duration: 90 QT Interval:  404 QTC Calculation: 427 R Axis:   81 Text Interpretation: Sinus rhythm ST elev, probable normal early repol pattern No previous tracing Confirmed by Blanchie Dessert [phone]) on 07/14/2021 7:55:32 AM  Radiology DG Chest 2 View  Result Date: 07/14/2021 CLINICAL DATA:  Chest pain for 1 day EXAM: CHEST - 2 VIEW COMPARISON:  October 04, 2016 FINDINGS: The heart size and mediastinal contours are within normal limits. Both lungs are clear. The visualized skeletal structures are unremarkable. IMPRESSION: No active cardiopulmonary disease. Electronically Signed   By: Abelardo Diesel M.D.   On: 07/14/2021 08:11    Procedures Procedures   Medications Ordered in ED Medications  alum & mag hydroxide-simeth (MAALOX/MYLANTA) 200-200-20 MG/5ML suspension 30 mL (has no administration in time range)    And  lidocaine (XYLOCAINE) 2 % viscous mouth solution 15 mL (has no administration in time range)    ED Course  I have reviewed the triage vital signs and the nursing notes.  Pertinent labs & imaging results that were available during my care of the patient were reviewed by me and considered in my medical decision making (see chart for details).    MDM Rules/Calculators/A&P                           45 year old male presenting today with complaint of chest pain.  He recently flew to [place] and back but not prolonged immobilization however mother with factor V Leiden and will rule out PE.  Patient is low risk Wells criteria.  He is not tachycardic or hypoxic at this time.  Breath sounds are clear.  Lower suspicion for pneumothorax, pneumonia, viral illness.  Concern for GERD,  pleurisy.  Low suspicion for ACS, dissection, pericarditis as patient's EKG shows no acute findings.  Patient was given a GI cocktail.  X-ray and labs are pending.  12:04 PM Patient's D-dimer and initial troponin are within normal limits.  CBC with minimal leukocytosis of 12,000 of unknown significance and CMP within normal limits.  On repeat evaluation patient has not had significant improvement with GI cocktail.  We will try Tylenol.  Delta troponin pending.  1:22 PM Delta trop is neg.  EKG repeat with minimal elevation of ST diffusely but no depressed PR or q-waves.  Will have pt treat with tylenol and f/u with pcp or return for worsening sx.  COVID and flu are neg.  MDM   Amount and/or Complexity of Data Reviewed Clinical lab tests: ordered and reviewed Tests in the radiology section of CPT: ordered and reviewed Tests in the medicine section of CPT: ordered and reviewed Independent visualization of images, tracings, or specimens: yes     Final Clinical Impression(s) / ED Diagnoses Final diagnoses:  Chest pain, unspecified type  Nonspecific chest pain    Rx / DC Orders ED Discharge Orders     None        Blanchie Dessert, MD 07/14/21 1335

## 2021-07-14 NOTE — ED Notes (Signed)
Patient given sandwich and crackers.

## 2021-07-30 DIAGNOSIS — J111 Influenza due to unidentified influenza virus with other respiratory manifestations: Secondary | ICD-10-CM | POA: Diagnosis not present

## 2021-08-28 DIAGNOSIS — J0111 Acute recurrent frontal sinusitis: Secondary | ICD-10-CM | POA: Diagnosis not present

## 2021-09-09 DIAGNOSIS — B349 Viral infection, unspecified: Secondary | ICD-10-CM | POA: Diagnosis not present

## 2021-09-09 DIAGNOSIS — J40 Bronchitis, not specified as acute or chronic: Secondary | ICD-10-CM | POA: Diagnosis not present

## 2021-11-14 DIAGNOSIS — F4321 Adjustment disorder with depressed mood: Secondary | ICD-10-CM | POA: Diagnosis not present

## 2021-11-23 DIAGNOSIS — F4321 Adjustment disorder with depressed mood: Secondary | ICD-10-CM | POA: Diagnosis not present

## 2021-11-29 ENCOUNTER — Other Ambulatory Visit: Payer: Self-pay | Admitting: Psychiatry

## 2021-11-29 DIAGNOSIS — F411 Generalized anxiety disorder: Secondary | ICD-10-CM

## 2021-11-30 DIAGNOSIS — F4321 Adjustment disorder with depressed mood: Secondary | ICD-10-CM | POA: Diagnosis not present

## 2021-12-06 DIAGNOSIS — F4321 Adjustment disorder with depressed mood: Secondary | ICD-10-CM | POA: Diagnosis not present

## 2021-12-28 DIAGNOSIS — F4321 Adjustment disorder with depressed mood: Secondary | ICD-10-CM | POA: Diagnosis not present

## 2022-01-02 IMAGING — CR DG CHEST 2V
2 series · 2 of 2 positions shown · non-contrast
Comparison: October 04, 2016

CLINICAL DATA: Chest pain for 1 day

EXAM:
CHEST - 2 VIEW

[w chest pa]
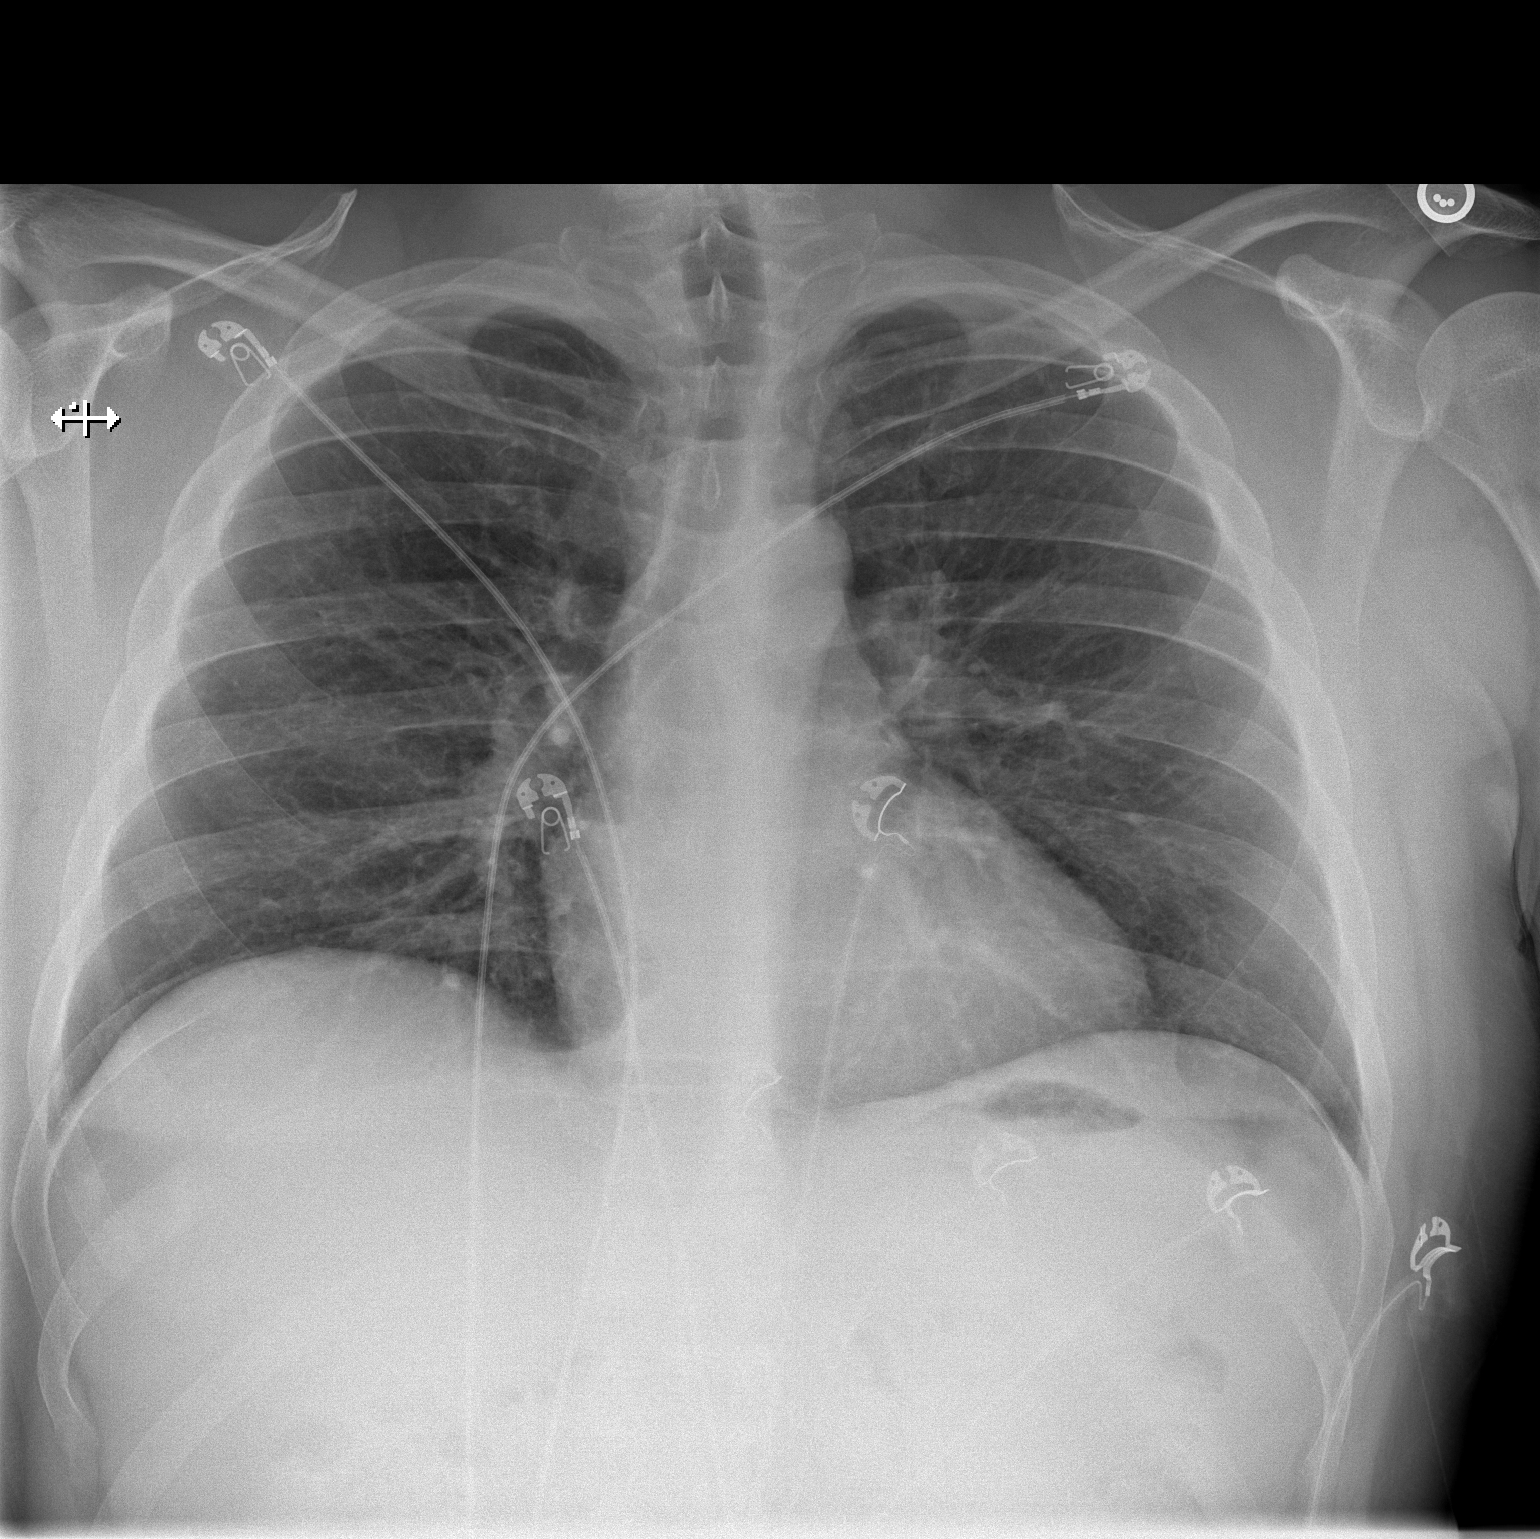

[w chest lat]
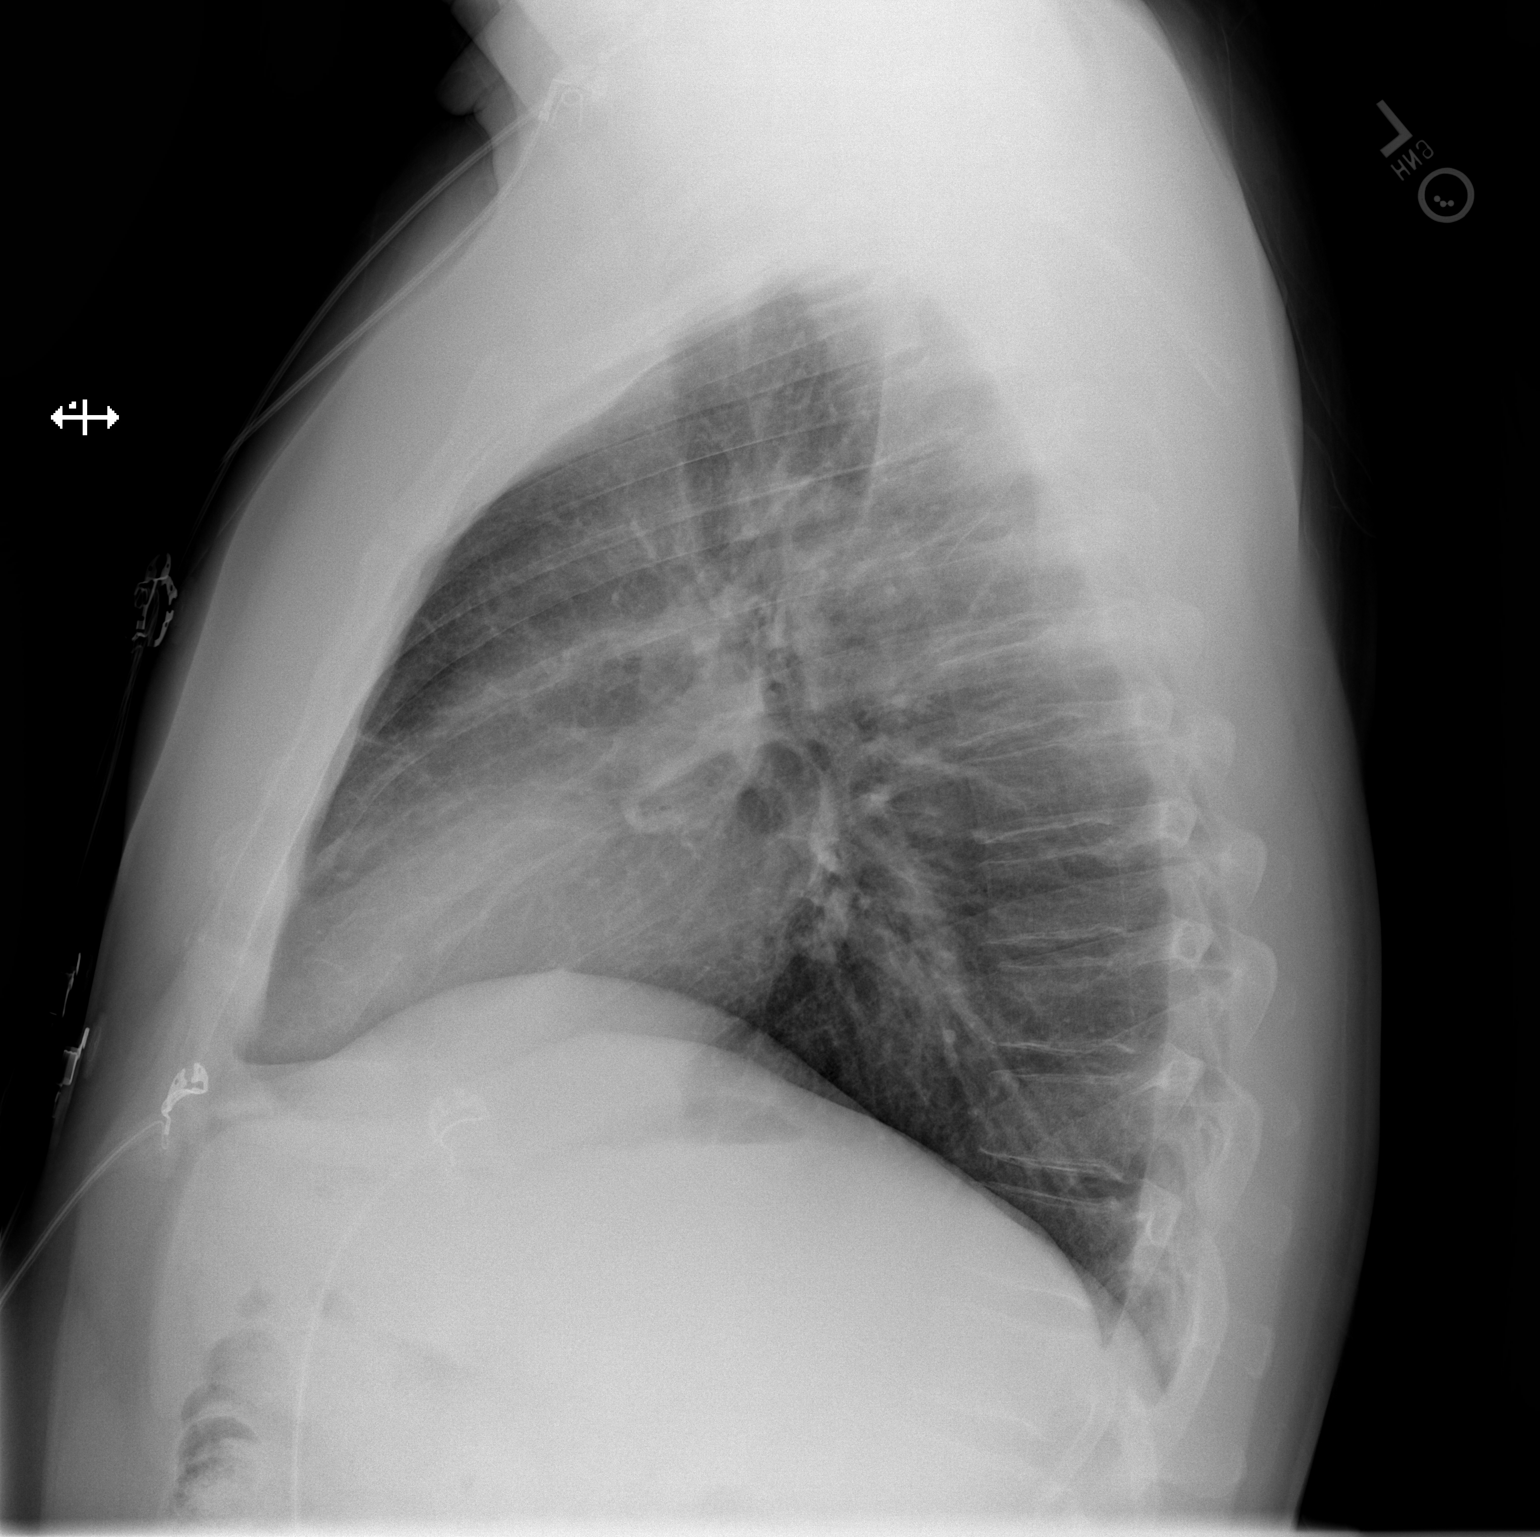

[2 of 2 positions shown; findings below may reference images not displayed]

FINDINGS: The heart size and mediastinal contours are within normal limits.
Both lungs are clear. The visualized skeletal structures are
unremarkable.
IMPRESSION: No active cardiopulmonary disease.

## 2022-01-03 DIAGNOSIS — F4321 Adjustment disorder with depressed mood: Secondary | ICD-10-CM | POA: Diagnosis not present

## 2022-01-10 DIAGNOSIS — F4321 Adjustment disorder with depressed mood: Secondary | ICD-10-CM | POA: Diagnosis not present

## 2022-01-17 DIAGNOSIS — F4321 Adjustment disorder with depressed mood: Secondary | ICD-10-CM | POA: Diagnosis not present

## 2022-01-24 DIAGNOSIS — F4321 Adjustment disorder with depressed mood: Secondary | ICD-10-CM | POA: Diagnosis not present

## 2022-01-31 DIAGNOSIS — F4321 Adjustment disorder with depressed mood: Secondary | ICD-10-CM | POA: Diagnosis not present

## 2022-02-07 DIAGNOSIS — F4321 Adjustment disorder with depressed mood: Secondary | ICD-10-CM | POA: Diagnosis not present

## 2022-02-14 DIAGNOSIS — F4321 Adjustment disorder with depressed mood: Secondary | ICD-10-CM | POA: Diagnosis not present

## 2022-02-21 DIAGNOSIS — F4321 Adjustment disorder with depressed mood: Secondary | ICD-10-CM | POA: Diagnosis not present

## 2022-02-23 ENCOUNTER — Other Ambulatory Visit: Payer: Self-pay | Admitting: Psychiatry

## 2022-02-23 DIAGNOSIS — G4700 Insomnia, unspecified: Secondary | ICD-10-CM

## 2022-03-07 DIAGNOSIS — F4321 Adjustment disorder with depressed mood: Secondary | ICD-10-CM | POA: Diagnosis not present

## 2022-03-12 ENCOUNTER — Other Ambulatory Visit (INDEPENDENT_AMBULATORY_CARE_PROVIDER_SITE_OTHER): Payer: BC Managed Care – PPO

## 2022-03-12 ENCOUNTER — Encounter: Payer: Self-pay | Admitting: Gastroenterology

## 2022-03-12 ENCOUNTER — Ambulatory Visit (INDEPENDENT_AMBULATORY_CARE_PROVIDER_SITE_OTHER)
Admission: RE | Admit: 2022-03-12 | Discharge: 2022-03-12 | Disposition: A | Discharge status: Home / Self Care | Payer: BC Managed Care – PPO | Source: Ambulatory Visit | Attending: Gastroenterology | Admitting: Gastroenterology

## 2022-03-12 ENCOUNTER — Ambulatory Visit: Payer: BC Managed Care – PPO | Admitting: Gastroenterology

## 2022-03-12 VITALS — BP 122/76 | HR 62 | Ht 69.0 in | Wt 240.0 lb

## 2022-03-12 DIAGNOSIS — R194 Change in bowel habit: Secondary | ICD-10-CM

## 2022-03-12 DIAGNOSIS — R140 Abdominal distension (gaseous): Secondary | ICD-10-CM

## 2022-03-12 DIAGNOSIS — K5900 Constipation, unspecified: Secondary | ICD-10-CM | POA: Diagnosis not present

## 2022-03-12 DIAGNOSIS — R109 Unspecified abdominal pain: Secondary | ICD-10-CM

## 2022-03-12 LAB — TSH: TSH: 1.37 u[IU]/mL (ref 0.35–5.50)

## 2022-03-12 LAB — COMPREHENSIVE METABOLIC PANEL
ALT: 13 U/L (ref 0–53)
AST: 17 U/L (ref 0–37)
Albumin: 4.5 g/dL (ref 3.5–5.2)
Alkaline Phosphatase: 54 U/L (ref 39–117)
BUN: 16 mg/dL (ref 6–23)
CO2: 27 mEq/L (ref 19–32)
Calcium: 9.4 mg/dL (ref 8.4–10.5)
Chloride: 105 mEq/L (ref 96–112)
Creatinine, Ser: 0.96 mg/dL (ref 0.40–1.50)
GFR: 95.47 mL/min (ref >60.00)
Glucose, Bld: 90 mg/dL (ref 70–99)
Potassium: 4.7 mEq/L (ref 3.5–5.1)
Sodium: 139 mEq/L (ref 135–145)
Total Bilirubin: 0.3 mg/dL (ref 0.2–1.2)
Total Protein: 6.5 g/dL (ref 6.0–8.3)

## 2022-03-12 NOTE — Patient Instructions (Addendum)
It was my pleasure to provide care to you today. Based on our discussion, I am providing you with my recommendations below:  RECOMMENDATION(S):   I recommend that you eat at least 25-30 grams of fiber daily and drink at least 64 ounces of water daily. You will want to gradually increase the fiber in your diet to avoid bloating. You may increase the fiber through diet and through fiber supplements including psyllium and methycellulose.   Natural laxatives include prunes, apples, apricots, cherries, peaches, pears, aloe, rhubarb, kiwi, bananas, mango, papaya, and watermelon. In particular, two kiwi a day has been show to cause less likely to cause bloating than prunes or psyllium.  As long as you have healthy kidneys, another options is using magnesium oxide supplements. I recommend starting with 500 mg daily. You could increase the dose to 1000 mg daily after one week if that doesn't seem to be helping.   I have recommended labs and an x-ray today for further evaluation of your recent change in bowel habits.   You can use Miralax 17 grams every day to even twice a daily if needed after you make these other dietary changes.  We don't have strong data supporting the use of probiotics. We discussed prebiotics and getting a variety of plant based foods every week to feed the healthy bacteria.     FOLLOW UP:  Please follow-up with me by MyChart if things are not improving.  BMI:  If you are age 45 or younger, your body mass index should be between 19-25. Your Body mass index is 35.44 kg/m. If this is out of the aformentioned range listed, please consider follow up with your Primary Care Provider.   MY CHART:  The [place] [facility] providers would like to encourage you to use [facility] to communicate with providers for non-urgent requests or questions.  Due to long hold times on the telephone, sending your provider a message by [facility] may be a faster and more efficient way to get a response.  Please  allow 48 business hours for a response.  Please remember that this is for non-urgent requests.   Thank you for trusting me with your gastrointestinal care!    Thornton Park, MD, MPH

## 2022-03-12 NOTE — Progress Notes (Signed)
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    - "abundant" esophageal eosinophils on EGD 2010, not quantified    - no eosinophils on mid, proximal, or distal esophageal biopsies 2022  History of colon polyps    - 6 tubular adenomas on colonoscopy 2022    - Surveillance colonoscopy recommended in 2025 Family history of colon polyps (brother and father) Intermittent rectal bleeding attributed to hemorrhoids Lactose intolerance    PLAN: - TSH, CMP - 2 view abdominal x-ray - Discussed dietary management of constipation including adding stool bulking agents - Discussed use of Miralax PRN to start, consider prescription medications if needed - Follow-up with MyChart message if not improving - Surveillance colonoscopy in 2025, earlier with new symtoms   Please see the "Patient Instructions" section for addition details about the plan.  HPI: Ernest Bowen is a 46 y.o. male who was seen in self-referral for change in bowel habits and intermittent BRB on the toilet paper that he attributed to hemorrhoids 04/09/21. Endoscopic evaluation was performed 04/10/21. He returns today to discuss constipation, abdominal pain, and bloating. The interval history is obtained through the patient and review of his electronic health record.   Presents today with 3 months of altered bowel habits. Now having a bowel movement every 3-5 days. Will have progressive pain and bloating the further he goes between bowel movements. No significant bleeding. There is significant straining and a sense of incomplete evacuation.  Baseline bowel habits are one formed bowel movement daily.   He is eating clean, taking probiotics, doing cardio several times a week,  drinking plenty of water and has added prunes PRN.    No new medications, diets, or changes in routine that occurred with change in bowel habits.   He feels he may be lactose intolerant as the constipation worsens with exposure to lactose.  He continues to take omeprazole 20 mg   Labs 07/14/21 showed normal CMP except for glucose 141 and normal CBC except for WBC 12.4. Hemoglobin, MCV, RDW, and platelets were normal. .   Endoscopic history: - EGD with Dr. Ardis Hughs 2010 for reflux and dysphagia showed a ringed esophagus. Esophageal biopsies showed intraepithelial eosinophils. Not quantified. Reflux has been controlled on daily omeprazole since that time. No symptoms with adherence. Heartburn and intermittent dysphagia if he misses several doses.  - EGD 04/10/2021 for dysphagia showed gastritis.  Antral mucosa showed focal intestinal metaplasia.  Gastric biopsies were negative for H. pylori.  Distal esophageal biopsies showed reflux.  Mid, proximal, and distal esophageal biopsies showed no eosinophilic esophagitis. - Colonoscopy 04/10/2021 showed 4 small rectal polyps and a 2 mm transverse colon polyp.  All polyps were tubular adenomas.     Past Medical History:  Diagnosis Date   Allergic rhinitis    Anxiety    Depression    GERD (gastroesophageal reflux disease)    HTN (hypertension)     Past Surgical History:  Procedure Laterality Date   FINGER SURGERY Right    pinky   INGUINAL HERNIA REPAIR Left    TONSILLECTOMY AND ADENOIDECTOMY     VASECTOMY      Current Outpatient Medications  Medication Sig Dispense Refill   diphenhydrAMINE (BENADRYL) 25 mg capsule Take 25 mg by mouth at bedtime.  omeprazole (PRILOSEC) 20 MG capsule TAKE ONE CAPSULE BY MOUTH EVERY DAY (Patient taking differently: Take 20 mg by mouth daily.) 90 capsule 2   sertraline (ZOLOFT) 100 MG tablet Take 1.5 tablets (150 mg total) by mouth daily. 135 tablet 3   traZODone (DESYREL) 50 MG tablet TAKE 1 TO 1 & 1/2  TABLETS BY MOUTH AT BEDTIME 135 tablet 0   Lemborexant (DAYVIGO) 5 MG TABS Take 5 mg by mouth at bedtime. Can increase to 10 mg po QHS after 3-5 days if 5 mg is ineffective (Patient not taking: Reported on 07/14/2021) 10 tablet 0   MELATONIN PO Take 1 tablet by mouth at bedtime. (Patient not taking: Reported on 03/12/2022)     No current facility-administered medications for this visit.    Allergies as of 03/12/2022 - Review Complete 03/12/2022  Allergen Reaction Noted   Penicillins Rash     Family History  Problem Relation Age of Onset   Clotting disorder Mother    Pulmonary embolism Mother    Factor V Leiden deficiency Mother    Colon polyps Father    Colon polyps Brother    Colon cancer Maternal Grandfather    Breast cancer Paternal Grandmother    Stomach cancer Neg Hx    Esophageal cancer Neg Hx       Physical Exam: General:   Alert,  well-nourished, pleasant and cooperative in NAD Head:  Normocephalic and atraumatic. Eyes:  Sclera clear, no icterus.   Conjunctiva pink. Abdomen:  Soft,nontender, nondistended, normal bowel sounds, no rebound or guarding. No hepatosplenomegaly.   Rectal:  Deferred  Msk:  Symmetrical. No boney deformities LAD: No inguinal or umbilical LAD Extremities:  No clubbing or edema. Neurologic:  Alert and  oriented x4;  grossly nonfocal Skin:  Intact without significant lesions or rashes. Psych:  Alert and cooperative. Normal mood and affect.    [name], MD, MPH 03/12/2022, 10:16 AM

## 2022-03-14 DIAGNOSIS — F4321 Adjustment disorder with depressed mood: Secondary | ICD-10-CM | POA: Diagnosis not present

## 2022-04-04 DIAGNOSIS — F4321 Adjustment disorder with depressed mood: Secondary | ICD-10-CM | POA: Diagnosis not present

## 2022-04-12 ENCOUNTER — Ambulatory Visit: Payer: BC Managed Care – PPO | Admitting: Psychiatry

## 2022-04-25 DIAGNOSIS — F4321 Adjustment disorder with depressed mood: Secondary | ICD-10-CM | POA: Diagnosis not present

## 2022-04-26 ENCOUNTER — Ambulatory Visit: Payer: BC Managed Care – PPO | Admitting: Psychiatry

## 2022-05-02 DIAGNOSIS — F4321 Adjustment disorder with depressed mood: Secondary | ICD-10-CM | POA: Diagnosis not present

## 2022-05-09 DIAGNOSIS — F4321 Adjustment disorder with depressed mood: Secondary | ICD-10-CM | POA: Diagnosis not present

## 2022-05-16 DIAGNOSIS — F4321 Adjustment disorder with depressed mood: Secondary | ICD-10-CM | POA: Diagnosis not present

## 2022-05-21 ENCOUNTER — Other Ambulatory Visit: Payer: Self-pay | Admitting: Psychiatry

## 2022-05-21 DIAGNOSIS — F411 Generalized anxiety disorder: Secondary | ICD-10-CM

## 2022-05-21 NOTE — Telephone Encounter (Signed)
Please schedule appt

## 2022-05-23 ENCOUNTER — Ambulatory Visit: Payer: BC Managed Care – PPO | Admitting: Psychiatry

## 2022-05-23 ENCOUNTER — Encounter: Payer: Self-pay | Admitting: Psychiatry

## 2022-05-23 DIAGNOSIS — G4700 Insomnia, unspecified: Secondary | ICD-10-CM

## 2022-05-23 DIAGNOSIS — F411 Generalized anxiety disorder: Secondary | ICD-10-CM | POA: Diagnosis not present

## 2022-05-23 MED ORDER — TRAZODONE HCL 50 MG PO TABS: ORAL_TABLET | ORAL | 3 refills | Status: DC

## 2022-05-23 MED ORDER — SERTRALINE HCL 100 MG PO TABS: 150.0000 mg | ORAL_TABLET | Freq: Every day | ORAL | 3 refills | Status: DC

## 2022-05-23 NOTE — Progress Notes (Signed)
Ernest Bowen [id number] [birth year] 46 y.o.  Subjective:   Patient ID:  Ernest Bowen is a 46 y.o. (DOB Aug 24, 1976) male.  Chief Complaint:  Chief Complaint  Patient presents with   Follow-up    ADHD, anxiety, and insomnia    HPI Ernest Bowen presents to the office today for follow-up of ADHD, anxiety, and insomnia.   Ernest Bowen reports that Ernest Bowen has been doing "very good" and denies any acute concerns. Ernest Bowen reports intentionally losing 15-20 lbs and is exercising regularly since mid-March. Ernest Bowen reports that Ernest Bowen gained 30-35 lbs during [place] with working 70-75 hours. Ernest Bowen has been setting boundaries at work and is now working 40-45 hours a week. Appetite has been good.   Ernest Bowen reports adequate concentration. Ernest Bowen reports that in the past his anxiety has been work-related and around meeting deadlines. Denies depressed mood. Ernest Bowen reports chronic insomnia. Ernest Bowen estimates sleeping 5-6 hours of sleep a night. Goes to bed by 10:30 pm. Ernest Bowen reports mental energy has been ok throughout the day and then is less later in the day. Energy and motivation have been good. Denies SI.   Has been seeing a therapist once a week and has found this helpful.   Sons have been active in wrestling. Oldest son won state championship.  Past Psychiatric Medication Trials: Sertraline-effective.  Some sexual side effects with increased to 150 mg daily Celexa-initially effective and was then no longer as effective for signs and symptoms. BuSpar-ineffective Trazodone-effective for insomnia Dayvigo Strattera-effective Klonopin Chantix-increased anxiety and irritability   Flowsheet Row ED from 07/14/2021 in [place] DEPT  C-SSRS RISK CATEGORY No Risk        Review of Systems:  Review of Systems  Cardiovascular:  Negative for chest pain.  Musculoskeletal:  Negative for gait problem.  Psychiatric/Behavioral:         Please refer to HPI    Medications: I have reviewed the patient's  current medications.  Current Outpatient Medications  Medication Sig Dispense Refill   MELATONIN PO Take 1 tablet by mouth at bedtime as needed.     omeprazole (PRILOSEC) 20 MG capsule TAKE ONE CAPSULE BY MOUTH EVERY DAY (Patient taking differently: Take 20 mg by mouth daily.) 90 capsule 2   diphenhydrAMINE (BENADRYL) 25 mg capsule Take 25 mg by mouth at bedtime.     sertraline (ZOLOFT) 100 MG tablet Take 1.5 tablets (150 mg total) by mouth daily. 135 tablet 3   traZODone (DESYREL) 50 MG tablet TAKE 1 TO 1 & 1/2 TABLETS BY MOUTH AT BEDTIME 135 tablet 3   No current facility-administered medications for this visit.    Medication Side Effects: None  Allergies:  Allergies  Allergen Reactions   Penicillins Rash    Has patient had a PCN reaction causing immediate rash, facial/tongue/throat swelling, SOB or lightheadedness with hypotension: No Has patient had a PCN reaction causing severe rash involving mucus membranes or skin necrosis: No Has patient had a PCN reaction that required hospitalization No Has patient had a PCN reaction occurring within the last 10 years: No If all of the above answers are "NO", then may proceed with Cephalosporin use.     Past Medical History:  Diagnosis Date   Allergic rhinitis    Anxiety    Depression    GERD (gastroesophageal reflux disease)    HTN (hypertension)     Past Medical History, Surgical history, Social history, and Family history were reviewed and updated as appropriate.   Please see review of  systems for further details on the patient's review from today.   Objective:   Physical Exam:  Wt 223 lb (101.2 kg)   BMI 32.93 kg/m   Physical Exam Constitutional:      General: Ernest Bowen is not in acute distress. Musculoskeletal:        General: No deformity.  Neurological:     Mental Status: Ernest Bowen is alert and oriented to person, place, and time.     Coordination: Coordination normal.  Psychiatric:        Attention and Perception: Attention  and perception normal. Ernest Bowen does not perceive auditory or visual hallucinations.        Mood and Affect: Mood normal. Mood is not anxious or depressed. Affect is not labile, blunt, angry or inappropriate.        Speech: Speech normal.        Behavior: Behavior normal.        Thought Content: Thought content normal. Thought content is not paranoid or delusional. Thought content does not include homicidal or suicidal ideation. Thought content does not include homicidal or suicidal plan.        Cognition and Memory: Cognition and memory normal.        Judgment: Judgment normal.     Comments: Insight intact     Lab Review:     Component Value Date/Time   NA 139 03/12/2022 1007   K 4.7 03/12/2022 1007   CL 105 03/12/2022 1007   CO2 27 03/12/2022 1007   GLUCOSE 90 03/12/2022 1007   GLUCOSE 94 09/03/2006 1138   BUN 16 03/12/2022 1007   CREATININE 0.96 03/12/2022 1007   CALCIUM 9.4 03/12/2022 1007   PROT 6.5 03/12/2022 1007   ALBUMIN 4.5 03/12/2022 1007   AST 17 03/12/2022 1007   ALT 13 03/12/2022 1007   ALKPHOS 54 03/12/2022 1007   BILITOT 0.3 03/12/2022 1007   GFRNONAA >60 07/14/2021 0831   GFRAA 144 10/11/2008 1027       Component Value Date/Time   WBC 12.4 (H) 07/14/2021 0831   RBC 4.93 07/14/2021 0831   HGB 15.2 07/14/2021 0831   HCT 44.8 07/14/2021 0831   PLT 261 07/14/2021 0831   MCV 90.9 07/14/2021 0831   MCH 30.8 07/14/2021 0831   MCHC 33.9 07/14/2021 0831   RDW 12.2 07/14/2021 0831   LYMPHSABS 1.4 07/14/2021 0831   MONOABS 0.7 07/14/2021 0831   EOSABS 0.1 07/14/2021 0831   BASOSABS 0.0 07/14/2021 0831    No results found for: "POCLITH", "LITHIUM"   No results found for: "PHENYTOIN", "PHENOBARB", "VALPROATE", "CBMZ"   .res Assessment: Plan:   Will continue current plan of care since target signs and symptoms are well controlled without any tolerability issues. Continue Sertraline 150 mg po qd for anxiety.  Continue Trazodone 50-75 mg po QHS prn insomnia.  Ernest Bowen  to follow-up in one year or sooner if clinically indicated.  Patient advised to contact office with any questions, adverse effects, or acute worsening in signs and symptoms.   Ernest Bowen was seen today for follow-up.  Diagnoses and all orders for this visit:  Generalized anxiety disorder -     sertraline (ZOLOFT) 100 MG tablet; Take 1.5 tablets (150 mg total) by mouth daily.  Insomnia, unspecified type -     traZODone (DESYREL) 50 MG tablet; TAKE 1 TO 1 & 1/2 TABLETS BY MOUTH AT BEDTIME     Please see After Visit Summary for patient specific instructions.  Future Appointments  Date Time Provider Department  Center  05/22/2023  4:00 PM Thayer Headings, PMHNP [facility] None    No orders of the defined types were placed in this encounter.   -------------------------------

## 2022-06-13 DIAGNOSIS — F4321 Adjustment disorder with depressed mood: Secondary | ICD-10-CM | POA: Diagnosis not present

## 2022-06-20 DIAGNOSIS — F4321 Adjustment disorder with depressed mood: Secondary | ICD-10-CM | POA: Diagnosis not present

## 2022-07-18 DIAGNOSIS — F4321 Adjustment disorder with depressed mood: Secondary | ICD-10-CM | POA: Diagnosis not present

## 2022-08-01 DIAGNOSIS — F4321 Adjustment disorder with depressed mood: Secondary | ICD-10-CM | POA: Diagnosis not present

## 2022-08-02 NOTE — Telephone Encounter (Signed)
Pt had appt 9/21

## 2022-08-08 DIAGNOSIS — F4321 Adjustment disorder with depressed mood: Secondary | ICD-10-CM | POA: Diagnosis not present

## 2022-08-15 DIAGNOSIS — F4321 Adjustment disorder with depressed mood: Secondary | ICD-10-CM | POA: Diagnosis not present

## 2022-08-22 DIAGNOSIS — F4321 Adjustment disorder with depressed mood: Secondary | ICD-10-CM | POA: Diagnosis not present

## 2022-09-05 DIAGNOSIS — F4321 Adjustment disorder with depressed mood: Secondary | ICD-10-CM | POA: Diagnosis not present

## 2022-09-12 DIAGNOSIS — F4321 Adjustment disorder with depressed mood: Secondary | ICD-10-CM | POA: Diagnosis not present

## 2022-09-19 DIAGNOSIS — F4321 Adjustment disorder with depressed mood: Secondary | ICD-10-CM | POA: Diagnosis not present

## 2022-09-26 DIAGNOSIS — F4321 Adjustment disorder with depressed mood: Secondary | ICD-10-CM | POA: Diagnosis not present

## 2022-10-24 DIAGNOSIS — F4321 Adjustment disorder with depressed mood: Secondary | ICD-10-CM | POA: Diagnosis not present

## 2023-01-13 DIAGNOSIS — F4321 Adjustment disorder with depressed mood: Secondary | ICD-10-CM | POA: Diagnosis not present

## 2023-01-23 DIAGNOSIS — F4321 Adjustment disorder with depressed mood: Secondary | ICD-10-CM | POA: Diagnosis not present

## 2023-01-30 DIAGNOSIS — F4321 Adjustment disorder with depressed mood: Secondary | ICD-10-CM | POA: Diagnosis not present

## 2023-02-06 DIAGNOSIS — F4321 Adjustment disorder with depressed mood: Secondary | ICD-10-CM | POA: Diagnosis not present

## 2023-02-13 DIAGNOSIS — F4321 Adjustment disorder with depressed mood: Secondary | ICD-10-CM | POA: Diagnosis not present

## 2023-02-14 ENCOUNTER — Other Ambulatory Visit: Payer: Self-pay | Admitting: Psychiatry

## 2023-02-14 DIAGNOSIS — F411 Generalized anxiety disorder: Secondary | ICD-10-CM

## 2023-02-18 DIAGNOSIS — F4321 Adjustment disorder with depressed mood: Secondary | ICD-10-CM | POA: Diagnosis not present

## 2023-02-25 DIAGNOSIS — F4321 Adjustment disorder with depressed mood: Secondary | ICD-10-CM | POA: Diagnosis not present

## 2023-03-04 DIAGNOSIS — F4321 Adjustment disorder with depressed mood: Secondary | ICD-10-CM | POA: Diagnosis not present

## 2023-03-13 DIAGNOSIS — F4321 Adjustment disorder with depressed mood: Secondary | ICD-10-CM | POA: Diagnosis not present

## 2023-03-20 DIAGNOSIS — F4321 Adjustment disorder with depressed mood: Secondary | ICD-10-CM | POA: Diagnosis not present

## 2023-04-10 DIAGNOSIS — F4321 Adjustment disorder with depressed mood: Secondary | ICD-10-CM | POA: Diagnosis not present

## 2023-05-09 ENCOUNTER — Other Ambulatory Visit: Payer: Self-pay | Admitting: Psychiatry

## 2023-05-09 DIAGNOSIS — G4700 Insomnia, unspecified: Secondary | ICD-10-CM

## 2023-05-22 ENCOUNTER — Ambulatory Visit: Payer: BC Managed Care – PPO | Admitting: Psychiatry

## 2023-05-29 DIAGNOSIS — F4321 Adjustment disorder with depressed mood: Secondary | ICD-10-CM | POA: Diagnosis not present

## 2023-06-05 DIAGNOSIS — F4321 Adjustment disorder with depressed mood: Secondary | ICD-10-CM | POA: Diagnosis not present

## 2023-06-12 DIAGNOSIS — F4321 Adjustment disorder with depressed mood: Secondary | ICD-10-CM | POA: Diagnosis not present

## 2023-06-13 ENCOUNTER — Ambulatory Visit: Payer: BC Managed Care – PPO | Admitting: Psychiatry

## 2023-06-13 ENCOUNTER — Encounter: Payer: Self-pay | Admitting: Psychiatry

## 2023-06-13 DIAGNOSIS — F411 Generalized anxiety disorder: Secondary | ICD-10-CM

## 2023-06-13 DIAGNOSIS — G4700 Insomnia, unspecified: Secondary | ICD-10-CM | POA: Diagnosis not present

## 2023-06-13 MED ORDER — SERTRALINE HCL 100 MG PO TABS: 150.0000 mg | ORAL_TABLET | Freq: Every day | ORAL | 3 refills | Status: DC

## 2023-06-13 MED ORDER — HYDROXYZINE PAMOATE 25 MG PO CAPS: 25.0000 mg | ORAL_CAPSULE | Freq: Every evening | ORAL | 5 refills | Status: DC | PRN

## 2023-06-13 MED ORDER — TRAZODONE HCL 50 MG PO TABS: ORAL_TABLET | ORAL | 3 refills | Status: DC

## 2023-06-13 NOTE — Progress Notes (Signed)
Ernest Bowen [id number] 08/14/1976 47 y.o.  Subjective:   Patient ID:  Ernest Bowen is a 47 y.o. (DOB 1976/05/31) male.  Chief Complaint:  Chief Complaint  Patient presents with   Follow-up    ADHD, anxiety, insomnia    HPI Ernest Bowen presents to the office today for follow-up of ADHD, anxiety, and insomnia. He reports, "things are good." He reports that he notices less stress over time and putting things into perspective. Denies any severe anxiety. Denies obsessive thoughts. Denies depressed mood. Energy and motivation have been good. Enjoys family. Denies difficulty with concentration or focus.He reports that he started treatment for weight loss about 3 weeks ago. Appetite has been ok. Reports some difficulty with sleep in response to restlessness with Mounjaro. He reports occasionally Trazodone is ineffective. Denies SI.   Oldest son is a Holiday representative in high school and youngest is a Medical laboratory scientific officer.   Past Psychiatric Medication Trials: Sertraline-effective.  Some sexual side effects with increased to 150 mg daily Celexa-initially effective and was then no longer as effective for signs and symptoms. BuSpar-ineffective Trazodone-effective for insomnia Dayvigo Strattera-effective Klonopin Chantix-increased anxiety and irritability  Flowsheet Row ED from 07/14/2021 in [facility] Emergency Department at [facility]  C-SSRS RISK CATEGORY No Risk        Review of Systems:  Review of Systems  Gastrointestinal:  Negative for constipation.  Musculoskeletal:  Negative for gait problem.  Psychiatric/Behavioral:         Please refer to HPI    Medications: I have reviewed the patient's current medications.  Current Outpatient Medications  Medication Sig Dispense Refill   diphenhydrAMINE (BENADRYL) 25 mg capsule Take 25 mg by mouth at bedtime.     hydrOXYzine (VISTARIL) 25 MG capsule Take 1 capsule (25 mg total) by mouth at bedtime as needed. 30 capsule 5    nicotine polacrilex (COMMIT) 2 MG lozenge Take 2 mg by mouth as needed for smoking cessation.     omeprazole (PRILOSEC) 20 MG capsule TAKE ONE CAPSULE BY MOUTH EVERY DAY (Patient taking differently: Take 20 mg by mouth daily.) 90 capsule 2   tirzepatide (MOUNJARO) 2.5 MG/0.5ML Pen Inject 2.5 mg into the skin once a week.     sertraline (ZOLOFT) 100 MG tablet Take 1.5 tablets (150 mg total) by mouth daily. 135 tablet 3   traZODone (DESYREL) 50 MG tablet TAKE 1 TO 1&1/2 TABLETS BY MOUTH NIGHTLY AT BEDTIME 135 tablet 3   No current facility-administered medications for this visit.    Medication Side Effects: None  Allergies:  Allergies  Allergen Reactions   Penicillins Rash    Has patient had a PCN reaction causing immediate rash, facial/tongue/throat swelling, SOB or lightheadedness with hypotension: No Has patient had a PCN reaction causing severe rash involving mucus membranes or skin necrosis: No Has patient had a PCN reaction that required hospitalization No Has patient had a PCN reaction occurring within the last 10 years: No If all of the above answers are "NO", then may proceed with Cephalosporin use.     Past Medical History:  Diagnosis Date   Allergic rhinitis    Anxiety    Depression    GERD (gastroesophageal reflux disease)    HTN (hypertension)     Past Medical History, Surgical history, Social history, and Family history were reviewed and updated as appropriate.   Please see review of systems for further details on the patient's review from today.   Objective:   Physical Exam:  There  were no vitals taken for this visit.  Physical Exam Constitutional:      General: He is not in acute distress. Musculoskeletal:        General: No deformity.  Neurological:     Mental Status: He is alert and oriented to person, place, and time.     Coordination: Coordination normal.  Psychiatric:        Attention and Perception: Attention and perception normal. He does not  perceive auditory or visual hallucinations.        Mood and Affect: Mood normal. Mood is not anxious or depressed. Affect is not labile, blunt, angry or inappropriate.        Speech: Speech normal.        Behavior: Behavior normal.        Thought Content: Thought content normal. Thought content is not paranoid or delusional. Thought content does not include homicidal or suicidal ideation. Thought content does not include homicidal or suicidal plan.        Cognition and Memory: Cognition and memory normal.        Judgment: Judgment normal.     Comments: Insight intact     Lab Review:     Component Value Date/Time   NA 139 03/12/2022 1007   K 4.7 03/12/2022 1007   CL 105 03/12/2022 1007   CO2 27 03/12/2022 1007   GLUCOSE 90 03/12/2022 1007   GLUCOSE 94 09/03/2006 1138   BUN 16 03/12/2022 1007   CREATININE 0.96 03/12/2022 1007   CALCIUM 9.4 03/12/2022 1007   PROT 6.5 03/12/2022 1007   ALBUMIN 4.5 03/12/2022 1007   AST 17 03/12/2022 1007   ALT 13 03/12/2022 1007   ALKPHOS 54 03/12/2022 1007   BILITOT 0.3 03/12/2022 1007   GFRNONAA >60 07/14/2021 0831   GFRAA 144 10/11/2008 1027       Component Value Date/Time   WBC 12.4 (H) 07/14/2021 0831   RBC 4.93 07/14/2021 0831   HGB 15.2 07/14/2021 0831   HCT 44.8 07/14/2021 0831   PLT 261 07/14/2021 0831   MCV 90.9 07/14/2021 0831   MCH 30.8 07/14/2021 0831   MCHC 33.9 07/14/2021 0831   RDW 12.2 07/14/2021 0831   LYMPHSABS 1.4 07/14/2021 0831   MONOABS 0.7 07/14/2021 0831   EOSABS 0.1 07/14/2021 0831   BASOSABS 0.0 07/14/2021 0831    No results found for: "POCLITH", "LITHIUM"   No results found for: "PHENYTOIN", "PHENOBARB", "VALPROATE", "CBMZ"   .res Assessment: Plan:    30 minutes spent dedicated to the care of this patient on the date of this encounter to include pre-visit review of records, ordering of medication, post visit documentation, and face-to-face time with the patient discussing recent insomnia and possible  treatment options. Discussed potential benefits, risks, and side effects of Hydroxyzine for insomnia. Discussed that hydroxyzine can be used in lieu of Benadryl prn. Discussed that he may wish to reduce Trazodone if Hydroxyzine is more effective for his insomnia.  Continue Sertraline 150 mg daily for anxiety.  Pt to follow-up in one year or sooner if clinically indicated. Patient advised to contact office with any questions, adverse effects, or acute worsening in signs and symptoms.  Jemar was seen today for follow-up.  Diagnoses and all orders for this visit:  Insomnia, unspecified type -     hydrOXYzine (VISTARIL) 25 MG capsule; Take 1 capsule (25 mg total) by mouth at bedtime as needed. -     traZODone (DESYREL) 50 MG tablet; TAKE 1 TO 1&1/2  TABLETS BY MOUTH NIGHTLY AT BEDTIME  Generalized anxiety disorder -     sertraline (ZOLOFT) 100 MG tablet; Take 1.5 tablets (150 mg total) by mouth daily.     Please see After Visit Summary for patient specific instructions.  Future Appointments  Date Time Provider Department Center  06/11/2024  8:30 AM Corie Chiquito, PMHNP [facility] None    No orders of the defined types were placed in this encounter.   -------------------------------

## 2023-06-27 DIAGNOSIS — F4321 Adjustment disorder with depressed mood: Secondary | ICD-10-CM | POA: Diagnosis not present

## 2023-07-16 ENCOUNTER — Encounter: Payer: Self-pay | Admitting: Psychiatry

## 2023-07-17 DIAGNOSIS — F4321 Adjustment disorder with depressed mood: Secondary | ICD-10-CM | POA: Diagnosis not present

## 2023-09-18 DIAGNOSIS — F4321 Adjustment disorder with depressed mood: Secondary | ICD-10-CM | POA: Diagnosis not present

## 2023-10-02 DIAGNOSIS — F4321 Adjustment disorder with depressed mood: Secondary | ICD-10-CM | POA: Diagnosis not present

## 2023-10-17 DIAGNOSIS — F4321 Adjustment disorder with depressed mood: Secondary | ICD-10-CM | POA: Diagnosis not present

## 2023-10-30 DIAGNOSIS — F4321 Adjustment disorder with depressed mood: Secondary | ICD-10-CM | POA: Diagnosis not present

## 2023-11-20 DIAGNOSIS — F4321 Adjustment disorder with depressed mood: Secondary | ICD-10-CM | POA: Diagnosis not present

## 2023-12-04 DIAGNOSIS — F4321 Adjustment disorder with depressed mood: Secondary | ICD-10-CM | POA: Diagnosis not present

## 2023-12-18 DIAGNOSIS — F4321 Adjustment disorder with depressed mood: Secondary | ICD-10-CM | POA: Diagnosis not present

## 2024-01-22 DIAGNOSIS — F4321 Adjustment disorder with depressed mood: Secondary | ICD-10-CM | POA: Diagnosis not present

## 2024-02-12 DIAGNOSIS — F4321 Adjustment disorder with depressed mood: Secondary | ICD-10-CM | POA: Diagnosis not present

## 2024-02-16 DIAGNOSIS — B354 Tinea corporis: Secondary | ICD-10-CM | POA: Diagnosis not present

## 2024-03-04 DIAGNOSIS — F4321 Adjustment disorder with depressed mood: Secondary | ICD-10-CM | POA: Diagnosis not present

## 2024-03-19 DIAGNOSIS — F4321 Adjustment disorder with depressed mood: Secondary | ICD-10-CM | POA: Diagnosis not present

## 2024-04-08 DIAGNOSIS — F4321 Adjustment disorder with depressed mood: Secondary | ICD-10-CM | POA: Diagnosis not present

## 2024-04-22 DIAGNOSIS — F4321 Adjustment disorder with depressed mood: Secondary | ICD-10-CM | POA: Diagnosis not present

## 2024-05-07 DIAGNOSIS — F4321 Adjustment disorder with depressed mood: Secondary | ICD-10-CM | POA: Diagnosis not present

## 2024-05-21 DIAGNOSIS — F4321 Adjustment disorder with depressed mood: Secondary | ICD-10-CM | POA: Diagnosis not present

## 2024-06-09 DIAGNOSIS — F4321 Adjustment disorder with depressed mood: Secondary | ICD-10-CM | POA: Diagnosis not present

## 2024-06-11 ENCOUNTER — Ambulatory Visit: Payer: BC Managed Care – PPO | Admitting: Psychiatry

## 2024-07-22 ENCOUNTER — Other Ambulatory Visit: Payer: Self-pay | Admitting: Psychiatry

## 2024-07-22 DIAGNOSIS — F411 Generalized anxiety disorder: Secondary | ICD-10-CM

## 2024-07-22 DIAGNOSIS — G4700 Insomnia, unspecified: Secondary | ICD-10-CM

## 2024-07-30 ENCOUNTER — Other Ambulatory Visit: Payer: Self-pay | Admitting: Psychiatry

## 2024-07-30 DIAGNOSIS — G4700 Insomnia, unspecified: Secondary | ICD-10-CM

## 2024-07-30 DIAGNOSIS — F411 Generalized anxiety disorder: Secondary | ICD-10-CM

## 2024-08-02 ENCOUNTER — Telehealth: Payer: Self-pay | Admitting: Psychiatry

## 2024-08-02 DIAGNOSIS — F411 Generalized anxiety disorder: Secondary | ICD-10-CM

## 2024-08-02 MED ORDER — SERTRALINE HCL 100 MG PO TABS: 150.0000 mg | ORAL_TABLET | Freq: Every day | ORAL | 0 refills | Status: DC

## 2024-08-02 NOTE — Telephone Encounter (Signed)
 Sent a 30-day supply of sertraline . No further refills.

## 2024-08-02 NOTE — Telephone Encounter (Signed)
 Patient called in stating that he pharmacy sent over prescription for Setraline 100mg . He saw JC last appt was last October. He says he was unaware that she had left. He asked if a prescription could be sent for him. He will call to scheduled with Harlene. Ph: [phone] [facility] [address] [place], [place]

## 2024-08-05 ENCOUNTER — Encounter: Payer: Self-pay | Admitting: Emergency Medicine

## 2024-08-05 ENCOUNTER — Other Ambulatory Visit: Payer: Self-pay | Admitting: Psychiatry

## 2024-08-05 ENCOUNTER — Ambulatory Visit: Admitting: Emergency Medicine

## 2024-08-05 VITALS — BP 129/92 | HR 93 | Ht 68.0 in | Wt 231.0 lb

## 2024-08-05 DIAGNOSIS — F411 Generalized anxiety disorder: Secondary | ICD-10-CM

## 2024-08-05 DIAGNOSIS — G4700 Insomnia, unspecified: Secondary | ICD-10-CM

## 2024-08-05 MED ORDER — SERTRALINE HCL 100 MG PO TABS: 150.0000 mg | ORAL_TABLET | Freq: Every day | ORAL | 0 refills | Status: DC

## 2024-08-05 MED ORDER — TRAZODONE HCL 50 MG PO TABS: ORAL_TABLET | ORAL | 3 refills | Status: AC

## 2024-08-05 NOTE — Progress Notes (Signed)
 Ernest Bowen [id number] 1976-07-14 48 y.o.  Subjective:   Patient ID:  Ernest Bowen is a 48 y.o. (DOB [birth year]) male.  Chief Complaint:  Chief Complaint  Patient presents with   Follow-up   Anxiety   Medication Refill   Other    insomnia   HPI Irwin Toran presents to the office today for follow-up for routine medication management.   LOV with [name] PMHNP - 06/13/2023. Recent medication refills by [name] MD on 11/20 (Trazodone ) and 11/28 (Sertraline ). Pt needed to see new provider to get future refills.   Current Psych Medication Regimen: No SE reported. Trazodone  75 mg po daily (taking 1.5 tablet of 50 mg) Sertraline  150 mg (taking 1.5 tablet of 100 mg)  Pt is doing well - reports things are good. Anxiety symptoms are well controlled with decreased worry and restlessness; no panic attacks reported. He has not had a panic attack in 25 years. Sleep is adequate and restful with Trazodone .   Appetite is stable, with normal weight and intact ADLs and personal hygiene.  He stopped Morjourno in April/May of this year and has been going to the gym daily for strength training, which he reports has been helping with mood. He has lost 10 lbs in the last 2 months. Denies significant depressive symptoms such as extreme sadness, tearfulness, or hopelessness. Energy and motivation are stable.Cognitive functions like concentration, decision-making, and memory are intact.   Pt was previously engaged in therapy [name] for the past 10 years, but discontinued temporarily a few years ago. He plans on restarting in the near future.  Denies mania, delirium, AVH, SI, HI or self-harm behaviors. No further complaints at this time.   Flowsheet Row ED from 07/14/2021 in [facility] Emergency Department at [facility]  C-SSRS RISK CATEGORY No Risk     Review of Systems:  Review of Systems  Psychiatric/Behavioral:         Please refer to HPI.  All other  systems reviewed and are negative.   Past medications for mental health diagnoses include: Sertraline -effective.  Some sexual side effects with increased to 150 mg daily Celexa -initially effective and was then no longer as effective for signs and symptoms. BuSpar-ineffective Trazodone -effective for insomnia Dayvigo  Strattera -effective Klonopin Chantix -increased anxiety and irritability  Medications: I have reviewed the patient's current medications.  Current Outpatient Medications  Medication Sig Dispense Refill   diphenhydrAMINE (BENADRYL) 25 mg capsule Take 25 mg by mouth at bedtime.     omeprazole  (PRILOSEC) 20 MG capsule TAKE ONE CAPSULE BY MOUTH EVERY DAY 90 capsule 2   sertraline  (ZOLOFT ) 100 MG tablet Take 1.5 tablets (150 mg total) by mouth daily. 45 tablet 0   traZODone  (DESYREL ) 50 MG tablet TAKE 1 TO 1&1/2 TABLETS BY MOUTH NIGHTLY AT BEDTIME 135 tablet 3   hydrOXYzine  (VISTARIL ) 25 MG capsule Take 1 capsule (25 mg total) by mouth at bedtime as needed. (Patient not taking: Reported on 08/05/2024) 30 capsule 5   tirzepatide (MOUNJARO) 2.5 MG/0.5ML Pen Inject 2.5 mg into the skin once a week. (Patient not taking: Reported on 08/05/2024)     No current facility-administered medications for this visit.    Medication Side Effects: None  Allergies:  Allergies  Allergen Reactions   Penicillins Rash    Has patient had a PCN reaction causing immediate rash, facial/tongue/throat swelling, SOB or lightheadedness with hypotension: No Has patient had a PCN reaction causing severe rash involving mucus membranes or skin necrosis: No Has patient  had a PCN reaction that required hospitalization No Has patient had a PCN reaction occurring within the last 10 years: No If all of the above answers are NO, then may proceed with Cephalosporin use.     Past Medical History:  Diagnosis Date   Allergic rhinitis    Anxiety    Depression    GERD (gastroesophageal reflux disease)    HTN  (hypertension)     Past Medical History, Surgical history, Social history, and Family history were reviewed and updated as appropriate.   Please see review of systems for further details on the patient's review from today.   Objective:   Physical Exam:  BP (!) 129/92   Pulse 93   Ht 5' 8 (1.727 m)   Wt 231 lb (104.8 kg)   BMI 35.12 kg/m   Physical Exam Constitutional:      Appearance: Normal appearance. He is normal weight.  Neurological:     General: No focal deficit present.     Mental Status: He is alert and oriented to person, place, and time. Mental status is at baseline.  Psychiatric:        Attention and Perception: Attention and perception normal.        Mood and Affect: Mood and affect normal.        Speech: Speech normal.        Behavior: Behavior normal. Behavior is cooperative.        Thought Content: Thought content normal.        Cognition and Memory: Cognition and memory normal.        Judgment: Judgment normal.     Lab Review:     Component Value Date/Time   NA 139 03/12/2022 1007   K 4.7 03/12/2022 1007   CL 105 03/12/2022 1007   CO2 27 03/12/2022 1007   GLUCOSE 90 03/12/2022 1007   GLUCOSE 94 09/03/2006 1138   BUN 16 03/12/2022 1007   CREATININE 0.96 03/12/2022 1007   CALCIUM 9.4 03/12/2022 1007   PROT 6.5 03/12/2022 1007   ALBUMIN 4.5 03/12/2022 1007   AST 17 03/12/2022 1007   ALT 13 03/12/2022 1007   ALKPHOS 54 03/12/2022 1007   BILITOT 0.3 03/12/2022 1007   GFRNONAA >60 07/14/2021 0831   GFRAA 144 10/11/2008 1027       Component Value Date/Time   WBC 12.4 (H) 07/14/2021 0831   RBC 4.93 07/14/2021 0831   HGB 15.2 07/14/2021 0831   HCT 44.8 07/14/2021 0831   PLT 261 07/14/2021 0831   MCV 90.9 07/14/2021 0831   MCH 30.8 07/14/2021 0831   MCHC 33.9 07/14/2021 0831   RDW 12.2 07/14/2021 0831   LYMPHSABS 1.4 07/14/2021 0831   MONOABS 0.7 07/14/2021 0831   EOSABS 0.1 07/14/2021 0831   BASOSABS 0.0 07/14/2021 0831    No results  found for: POCLITH, LITHIUM   No results found for: PHENYTOIN, PHENOBARB, VALPROATE, CBMZ   .res Assessment: Plan:    Ladarren was seen today for follow-up, anxiety, medication refill and other.  Diagnoses and all orders for this visit:  GAD (generalized anxiety disorder) -     sertraline  (ZOLOFT ) 100 MG tablet; Take 1.5 tablets (150 mg total) by mouth daily.  Insomnia, unspecified type -     traZODone  (DESYREL ) 50 MG tablet; TAKE 1 TO 1&1/2 TABLETS BY MOUTH NIGHTLY AT BEDTIME     Please see After Visit Summary for patient specific instructions.  Future Appointments  Date Time Provider Department Center  02/03/2025  9:00 AM Harve Spradley, PA-C [facility] None     No orders of the defined types were placed in this encounter.  I provided approximately 45 minutes of face to face time during this encounter, including time spent before and after the visit in records review, medical decision making, counseling pertinent to today's visit, and charting.   Discussed dx and tx plan. Discussed alternative options including therapy.   PDMP reviewed: low risk trend   Generalized Anxiety Disorder - not controlled Continue Sertraline  150 mg (taking 1.5 tablet of 100 mg) Will consider increasing to 200 mg if symptoms worsen Continue Trazodone  75 mg po daily (taking 1.5 tablet of 50 mg) Will consider increasing to 100 mg if symptoms worsen Advised to keep a medication side effect journal to systematically track any symptoms, noting onset, frequency, severity, and context of side effects. Psychotherapy was strongly recommended as important adjunct treatment to medication - provided list of local counselors. Discussed therapeutic lifestyle modifications emphasizing the importance of regular exercise of at least 3x a week for 30 min, incorporating healthier diet options with a focus on balanced nutrition and reduced processed foods, and considering appropriate supplementation  including multi vitamin, vitamin d, b 12, magnesium glycinate, iron to support overall well-being.  FOLLOW UP: 6 months or sooner if clinically indicated  Risks, benefits, and alternatives of the medications and treatment plan prescribed today were discussed. Patient engaged in shared decision-making;treatment plan reviewed and agreed upon.    Reymond Maynez PA-C

## 2024-08-11 ENCOUNTER — Encounter: Payer: Self-pay | Admitting: Gastroenterology

## 2024-08-18 ENCOUNTER — Ambulatory Visit

## 2024-08-18 VITALS — Ht 69.0 in | Wt 228.0 lb

## 2024-08-18 DIAGNOSIS — Z860100 Personal history of colon polyps, unspecified: Secondary | ICD-10-CM

## 2024-08-18 MED ORDER — NA SULFATE-K SULFATE-MG SULF 17.5-3.13-1.6 GM/177ML PO SOLN: 1.0000 | Freq: Once | ORAL | 0 refills | Status: AC

## 2024-08-18 NOTE — Progress Notes (Signed)

## 2024-08-19 DIAGNOSIS — L821 Other seborrheic keratosis: Secondary | ICD-10-CM | POA: Diagnosis not present

## 2024-08-19 DIAGNOSIS — D225 Melanocytic nevi of trunk: Secondary | ICD-10-CM | POA: Diagnosis not present

## 2024-08-19 DIAGNOSIS — D692 Other nonthrombocytopenic purpura: Secondary | ICD-10-CM | POA: Diagnosis not present

## 2024-08-19 DIAGNOSIS — L738 Other specified follicular disorders: Secondary | ICD-10-CM | POA: Diagnosis not present

## 2024-09-06 ENCOUNTER — Encounter: Payer: Self-pay | Admitting: Gastroenterology

## 2024-09-08 ENCOUNTER — Ambulatory Visit: Admitting: Gastroenterology

## 2024-09-08 ENCOUNTER — Encounter: Payer: Self-pay | Admitting: Gastroenterology

## 2024-09-08 VITALS — BP 105/70 | HR 54 | Temp 98.1°F | Resp 11 | Ht 69.0 in | Wt 228.0 lb

## 2024-09-08 DIAGNOSIS — Z860101 Personal history of adenomatous and serrated colon polyps: Secondary | ICD-10-CM

## 2024-09-08 DIAGNOSIS — D124 Benign neoplasm of descending colon: Secondary | ICD-10-CM

## 2024-09-08 DIAGNOSIS — K635 Polyp of colon: Secondary | ICD-10-CM | POA: Diagnosis not present

## 2024-09-08 DIAGNOSIS — Z83719 Family history of colon polyps, unspecified: Secondary | ICD-10-CM

## 2024-09-08 DIAGNOSIS — Z800 Family history of malignant neoplasm of digestive organs: Secondary | ICD-10-CM | POA: Diagnosis not present

## 2024-09-08 DIAGNOSIS — Z1211 Encounter for screening for malignant neoplasm of colon: Secondary | ICD-10-CM

## 2024-09-08 DIAGNOSIS — D125 Benign neoplasm of sigmoid colon: Secondary | ICD-10-CM

## 2024-09-08 DIAGNOSIS — Z860100 Personal history of colon polyps, unspecified: Secondary | ICD-10-CM

## 2024-09-08 DIAGNOSIS — D123 Benign neoplasm of transverse colon: Secondary | ICD-10-CM

## 2024-09-08 MED ORDER — SODIUM CHLORIDE 0.9 % IV SOLN: 500.0000 mL | INTRAVENOUS | Status: DC

## 2024-09-08 NOTE — Progress Notes (Signed)
 "   GASTROENTEROLOGY PROCEDURE H&P NOTE   Primary Care Physician: Diedra Lame, MD    Reason for Procedure:  Colon polyp surveillance, family history of colon cancer  Plan:    Colonoscopy  Patient is appropriate for endoscopic procedure(s) in the ambulatory ([facility]) setting.  The nature of the procedure, as well as the risks, benefits, and alternatives were carefully and thoroughly reviewed with the patient. Ample time for discussion and questions allowed. The patient understood, was satisfied, and agreed to proceed. I personally addressed all patient questions and concerns.     HPI: Ernest Bowen is a 49 y.o. male who presents for colonoscopy for ongoing colon polyp surveillance and colon cancer screening.   Patient is otherwise without complaints or active issues today.  Last colonoscopy was 04/2021 and notable for 4 rectal polyps ranging 1-4 mm, 2 mm transverse colon adenomas with recommendation to repeat in 3 years due to multiple adenomas and family history of colon cancer.  Brother with colon polyps starting at age 40.  Father with colon polyps.  Maternal grandfather with colon cancer.  Past Medical History:  Diagnosis Date   Allergic rhinitis    Allergy    Anxiety    Depression    GERD (gastroesophageal reflux disease)    HTN (hypertension)     Past Surgical History:  Procedure Laterality Date   FINGER SURGERY Right    pinky   INGUINAL HERNIA REPAIR Left    TONSILLECTOMY AND ADENOIDECTOMY     VASECTOMY      Prior to Admission medications  Medication Sig Start Date End Date Taking? Authorizing Provider  diphenhydrAMINE (BENADRYL) 25 mg capsule Take 25 mg by mouth at bedtime.   Yes [provider]  omeprazole  (PRILOSEC) 20 MG capsule TAKE ONE CAPSULE BY MOUTH EVERY DAY 06/02/12  Yes Swords, Wolm DEL, MD  sertraline  (ZOLOFT ) 100 MG tablet Take 1.5 tablets (150 mg total) by mouth daily. 08/05/24  Yes Suthaharan, Lovepriya, PA-C  traZODone  (DESYREL ) 50 MG  tablet TAKE 1 TO 1&1/2 TABLETS BY MOUTH NIGHTLY AT BEDTIME 08/05/24  Yes Suthaharan, Lovepriya, PA-C    Current Outpatient Medications  Medication Sig Dispense Refill   diphenhydrAMINE (BENADRYL) 25 mg capsule Take 25 mg by mouth at bedtime.     omeprazole  (PRILOSEC) 20 MG capsule TAKE ONE CAPSULE BY MOUTH EVERY DAY 90 capsule 2   sertraline  (ZOLOFT ) 100 MG tablet Take 1.5 tablets (150 mg total) by mouth daily. 45 tablet 0   traZODone  (DESYREL ) 50 MG tablet TAKE 1 TO 1&1/2 TABLETS BY MOUTH NIGHTLY AT BEDTIME 135 tablet 3   Current Facility-Administered Medications  Medication Dose Route Frequency Provider Last Rate Last Admin   0.9 %  sodium chloride  infusion  500 mL Intravenous Continuous Noland Pizano V, DO        Allergies as of 09/08/2024 - Review Complete 09/08/2024  Allergen Reaction Noted   Penicillins Rash     Family History  Problem Relation Age of Onset   Clotting disorder Mother    Pulmonary embolism Mother    Factor V Leiden deficiency Mother    Colon polyps Father    Colon polyps Brother    Colon polyps Maternal Grandfather    Colon cancer Maternal Grandfather    Breast cancer Paternal Grandmother    Colon polyps Cousin    Colon cancer Cousin    Stomach cancer Neg Hx    Esophageal cancer Neg Hx    Rectal cancer Neg Hx  Social History   Socioeconomic History   Marital status: Married    Spouse name: Not on file   Number of children: 2   Years of education: Not on file   Highest education level: Not on file  Occupational History   Occupation: sales  Tobacco Use   Smoking status: Former    Current packs/day: 0.00    Types: Cigarettes    Quit date: 1997    Years since quitting: 29.0   Smokeless tobacco: Former    Types: Chew    Quit date: 2018   Tobacco comments:    Cig in college years, use Nicorette lozenges  Vaping Use   Vaping status: Never Used  Substance and Sexual Activity   Alcohol use: Yes    Comment: 1-2 per day   Drug use: No    Sexual activity: Not on file  Other Topics Concern   Not on file  Social History Narrative   Not on file   Social Drivers of Health   Tobacco Use: Medium Risk (09/08/2024)   Patient History    Smoking Tobacco Use: Former    Smokeless Tobacco Use: Former    Passive Exposure: Not on Stage Manager: Not on Ship [employer] Insecurity: Not on file  Transportation Needs: Not on file  Physical Activity: Not on file  Stress: Not on file  Social Connections: Not on file  Intimate Partner Violence: Not on file  Depression (PHQ2-9): Not on file  Alcohol Screen: Not on file  Housing: Not on file  Utilities: Not on file  Health Literacy: Not on file    Physical Exam: Vital signs in last 24 hours: @BP  132/66   Pulse 64   Temp 98.1 F (36.7 C)   Ht 5' 9 (1.753 m)   Wt 228 lb (103.4 kg)   SpO2 94%   BMI 33.67 kg/m  GEN: NAD EYE: Sclerae anicteric ENT: MMM CV: Non-tachycardic Pulm: CTA b/l GI: Soft, NT/ND NEURO:  Alert & Oriented x 3   Sandor Flatter, DO [facility]   09/08/2024 7:54 AM  "

## 2024-09-08 NOTE — Progress Notes (Signed)
 Called to room to assist during endoscopic procedure.  Patient ID and intended procedure confirmed with present staff. Received instructions for my participation in the procedure from the performing physician.

## 2024-09-08 NOTE — Progress Notes (Signed)
 Vss nad trans to pacu

## 2024-09-08 NOTE — Patient Instructions (Signed)

## 2024-09-08 NOTE — Progress Notes (Signed)
 Pt's states no medical or surgical changes since previsit or office visit.

## 2024-09-08 NOTE — Op Note (Signed)
 [facility] Patient Name: Ernest Bowen Procedure Date: 09/08/2024 8:07 AM MRN: [id number] Endoscopist: Sandor Flatter , MD, [id number] Age: 49 Referring MD:  Date of Birth: September 07, 1975 Gender: Male Account #: [id number] Procedure:                Colonoscopy Indications:              Surveillance: Personal history of adenomatous                            polyps on last colonoscopy 3 years ago                           Family history of colon cancer and colon polyps                           No recent GI symptoms.                           Last colonoscopy was 04/2021 and notable for 4                            rectal polyps ranging 1-4 mm, 2 mm transverse colon                            adenomas with recommendation to repeat in 3 years                            due to multiple adenomas and family history of                            colon cancer. Medicines:                Monitored Anesthesia Care Procedure:                Pre-Anesthesia Assessment:                           - Prior to the procedure, a History and Physical                            was performed, and patient medications and                            allergies were reviewed. The patient's tolerance of                            previous anesthesia was also reviewed. The risks                            and benefits of the procedure and the sedation                            options and risks were discussed with the patient.  All questions were answered, and informed consent                            was obtained. Prior Anticoagulants: The patient has                            taken no anticoagulant or antiplatelet agents. ASA                            Grade Assessment: II - A patient with mild systemic                            disease. After reviewing the risks and benefits,                            the patient was deemed in satisfactory condition to                             undergo the procedure.                           After obtaining informed consent, the colonoscope                            was passed under direct vision. Throughout the                            procedure, the patient's blood pressure, pulse, and                            oxygen saturations were monitored continuously. The                            Olympus Scope SN [id number] was introduced through the                            anus and advanced to the the terminal ileum. The                            colonoscopy was performed without difficulty. The                            patient tolerated the procedure well. The quality                            of the bowel preparation was good. The terminal                            ileum, ileocecal valve, appendiceal orifice, and                            rectum were photographed. Scope In: 8:09:33 AM Scope Out: 8:27:46 AM Scope Withdrawal Time: 0 hours 15 minutes 29 seconds  Total Procedure  Duration: 0 hours 18 minutes 13 seconds  Findings:                 The perianal and digital rectal examinations were                            normal.                           Two mucous-capped and sessile polyps were found in                            the transverse colon. The polyps were 3 to 4 mm in                            size. These polyps were removed with a cold snare.                            Resection and retrieval were complete. Estimated                            blood loss was minimal.                           Two sessile polyps were found in the sigmoid colon                            and descending colon. The polyps were 3 to 4 mm in                            size. These polyps were removed with a cold snare.                            Resection and retrieval were complete. Estimated                            blood loss was minimal.                           The retroflexed view of the distal rectum and anal                             verge was normal and showed no anal or rectal                            abnormalities.                           The terminal ileum appeared normal. Complications:            No immediate complications. Estimated Blood Loss:     Estimated blood loss was minimal. Impression:               - Two 3 to 4 mm polyps in the transverse colon,  removed with a cold snare. Resected and retrieved.                           - Two 3 to 4 mm polyps in the sigmoid colon and in                            the descending colon, removed with a cold snare.                            Resected and retrieved.                           - The distal rectum and anal verge are normal on                            retroflexion view.                           - The examined portion of the ileum was normal. Recommendation:           - Patient has a contact number available for                            emergencies. The signs and symptoms of potential                            delayed complications were discussed with the                            patient. Return to normal activities tomorrow.                            Written discharge instructions were provided to the                            patient.                           - Resume previous diet.                           - Continue present medications.                           - Await pathology results.                           - Repeat colonoscopy in 3 - 5 years for                            surveillance based on pathology results.                           - Return to GI office PRN. Sandor Flatter, MD 09/08/2024 8:42:42 AM

## 2024-09-09 ENCOUNTER — Telehealth: Payer: Self-pay | Admitting: *Deleted

## 2024-09-09 NOTE — Telephone Encounter (Signed)
" °  Follow up Call-     09/08/2024    7:21 AM  Call back number  Post procedure Call Back phone  # [phone]  Permission to leave phone message Yes     Patient questions:  Do you have a fever, pain , or abdominal swelling? No. Pain Score  0 *  Have you tolerated food without any problems? Yes.    Have you been able to return to your normal activities? Yes.    Do you have any questions about your discharge instructions: Diet   No. Medications  No. Follow up visit  No.  Do you have questions or concerns about your Care? No.  Actions: * If pain score is 4 or above: No action needed, pain <4.   "

## 2024-09-10 LAB — SURGICAL PATHOLOGY

## 2024-09-13 ENCOUNTER — Ambulatory Visit: Payer: Self-pay | Admitting: Gastroenterology

## 2024-09-29 ENCOUNTER — Other Ambulatory Visit: Payer: Self-pay

## 2024-09-29 DIAGNOSIS — F411 Generalized anxiety disorder: Secondary | ICD-10-CM

## 2024-09-29 MED ORDER — SERTRALINE HCL 100 MG PO TABS: 150.0000 mg | ORAL_TABLET | Freq: Every day | ORAL | 0 refills | Status: AC

## 2025-02-03 ENCOUNTER — Ambulatory Visit: Admitting: Emergency Medicine
